# Patient Record
Sex: Female | Born: 1950 | Race: Black or African American | Hispanic: No | Smoking: Never smoker
Health system: Southern US, Community
[De-identification: ages and names within clinical notes are randomized; demographics above are authoritative.]

## PROBLEM LIST (undated history)

## (undated) DIAGNOSIS — D259 Leiomyoma of uterus, unspecified: Secondary | ICD-10-CM

## (undated) DIAGNOSIS — N3289 Other specified disorders of bladder: Secondary | ICD-10-CM

## (undated) DIAGNOSIS — F439 Reaction to severe stress, unspecified: Secondary | ICD-10-CM

## (undated) DIAGNOSIS — I10 Essential (primary) hypertension: Secondary | ICD-10-CM

## (undated) DIAGNOSIS — B009 Herpesviral infection, unspecified: Secondary | ICD-10-CM

## (undated) DIAGNOSIS — D219 Benign neoplasm of connective and other soft tissue, unspecified: Secondary | ICD-10-CM

## (undated) DIAGNOSIS — Z87448 Personal history of other diseases of urinary system: Secondary | ICD-10-CM

## (undated) DIAGNOSIS — N926 Irregular menstruation, unspecified: Secondary | ICD-10-CM

## (undated) DIAGNOSIS — Z973 Presence of spectacles and contact lenses: Secondary | ICD-10-CM

## (undated) DIAGNOSIS — E039 Hypothyroidism, unspecified: Secondary | ICD-10-CM

## (undated) DIAGNOSIS — G4700 Insomnia, unspecified: Secondary | ICD-10-CM

## (undated) DIAGNOSIS — M1990 Unspecified osteoarthritis, unspecified site: Secondary | ICD-10-CM

## (undated) DIAGNOSIS — K5909 Other constipation: Secondary | ICD-10-CM

## (undated) DIAGNOSIS — R002 Palpitations: Secondary | ICD-10-CM

## (undated) DIAGNOSIS — M549 Dorsalgia, unspecified: Secondary | ICD-10-CM

## (undated) HISTORY — DX: Insomnia, unspecified: G47.00

## (undated) HISTORY — PX: OTHER SURGICAL HISTORY: SHX169

## (undated) HISTORY — DX: Irregular menstruation, unspecified: N92.6

## (undated) HISTORY — DX: Dorsalgia, unspecified: M54.9

## (undated) HISTORY — DX: Personal history of other diseases of urinary system: Z87.448

## (undated) HISTORY — DX: Benign neoplasm of connective and other soft tissue, unspecified: D21.9

## (undated) HISTORY — PX: TUBAL LIGATION: SHX77

## (undated) HISTORY — DX: Reaction to severe stress, unspecified: F43.9

---

## 1999-03-18 ENCOUNTER — Other Ambulatory Visit: Admission: RE | Admit: 1999-03-18 | Discharge: 1999-03-18 | Payer: Self-pay | Admitting: Obstetrics and Gynecology

## 2000-01-28 ENCOUNTER — Encounter: Payer: Self-pay | Admitting: Obstetrics and Gynecology

## 2000-01-28 ENCOUNTER — Encounter: Admission: RE | Admit: 2000-01-28 | Discharge: 2000-01-28 | Payer: Self-pay | Admitting: Obstetrics and Gynecology

## 2000-04-25 ENCOUNTER — Other Ambulatory Visit: Admission: RE | Admit: 2000-04-25 | Discharge: 2000-04-25 | Payer: Self-pay | Admitting: Obstetrics and Gynecology

## 2001-02-02 ENCOUNTER — Encounter: Admission: RE | Admit: 2001-02-02 | Discharge: 2001-02-02 | Payer: Self-pay | Admitting: Obstetrics and Gynecology

## 2001-02-02 ENCOUNTER — Encounter: Payer: Self-pay | Admitting: Obstetrics and Gynecology

## 2001-05-07 ENCOUNTER — Other Ambulatory Visit: Admission: RE | Admit: 2001-05-07 | Discharge: 2001-05-07 | Payer: Self-pay | Admitting: Obstetrics and Gynecology

## 2002-02-15 ENCOUNTER — Encounter: Admission: RE | Admit: 2002-02-15 | Discharge: 2002-02-15 | Payer: Self-pay | Admitting: Obstetrics and Gynecology

## 2002-02-15 ENCOUNTER — Encounter: Payer: Self-pay | Admitting: Obstetrics and Gynecology

## 2002-05-13 ENCOUNTER — Other Ambulatory Visit: Admission: RE | Admit: 2002-05-13 | Discharge: 2002-05-13 | Payer: Self-pay | Admitting: Obstetrics and Gynecology

## 2003-01-03 ENCOUNTER — Encounter: Payer: Self-pay | Admitting: Gastroenterology

## 2003-01-03 ENCOUNTER — Encounter: Admission: RE | Admit: 2003-01-03 | Discharge: 2003-01-03 | Payer: Self-pay | Admitting: Gastroenterology

## 2003-03-17 ENCOUNTER — Encounter: Admission: RE | Admit: 2003-03-17 | Discharge: 2003-03-17 | Payer: Self-pay | Admitting: Obstetrics and Gynecology

## 2003-03-17 ENCOUNTER — Encounter: Payer: Self-pay | Admitting: Obstetrics and Gynecology

## 2003-05-26 ENCOUNTER — Other Ambulatory Visit: Admission: RE | Admit: 2003-05-26 | Discharge: 2003-05-26 | Payer: Self-pay | Admitting: Obstetrics and Gynecology

## 2004-03-19 ENCOUNTER — Encounter: Admission: RE | Admit: 2004-03-19 | Discharge: 2004-03-19 | Payer: Self-pay | Admitting: Obstetrics and Gynecology

## 2004-05-26 ENCOUNTER — Other Ambulatory Visit: Admission: RE | Admit: 2004-05-26 | Discharge: 2004-05-26 | Payer: Self-pay | Admitting: Obstetrics and Gynecology

## 2005-03-25 ENCOUNTER — Encounter: Admission: RE | Admit: 2005-03-25 | Discharge: 2005-03-25 | Payer: Self-pay | Admitting: Obstetrics and Gynecology

## 2005-06-15 ENCOUNTER — Other Ambulatory Visit: Admission: RE | Admit: 2005-06-15 | Discharge: 2005-06-15 | Payer: Self-pay | Admitting: Obstetrics and Gynecology

## 2005-06-30 ENCOUNTER — Other Ambulatory Visit: Admission: RE | Admit: 2005-06-30 | Discharge: 2005-06-30 | Payer: Self-pay | Admitting: Obstetrics and Gynecology

## 2006-03-27 ENCOUNTER — Encounter: Admission: RE | Admit: 2006-03-27 | Discharge: 2006-03-27 | Payer: Self-pay | Admitting: Obstetrics and Gynecology

## 2006-06-21 ENCOUNTER — Other Ambulatory Visit: Admission: RE | Admit: 2006-06-21 | Discharge: 2006-06-21 | Payer: Self-pay | Admitting: Obstetrics and Gynecology

## 2006-09-13 ENCOUNTER — Emergency Department (HOSPITAL_COMMUNITY): Admission: EM | Admit: 2006-09-13 | Discharge: 2006-09-13 | Payer: Self-pay | Admitting: Emergency Medicine

## 2007-03-30 ENCOUNTER — Encounter: Admission: RE | Admit: 2007-03-30 | Discharge: 2007-03-30 | Payer: Self-pay | Admitting: Obstetrics and Gynecology

## 2008-04-04 ENCOUNTER — Encounter: Admission: RE | Admit: 2008-04-04 | Discharge: 2008-04-04 | Payer: Self-pay | Admitting: Obstetrics and Gynecology

## 2008-11-28 DIAGNOSIS — E890 Postprocedural hypothyroidism: Secondary | ICD-10-CM

## 2008-11-28 HISTORY — DX: Postprocedural hypothyroidism: E89.0

## 2009-04-01 ENCOUNTER — Encounter: Admission: RE | Admit: 2009-04-01 | Discharge: 2009-04-01 | Payer: Self-pay | Admitting: Emergency Medicine

## 2009-04-10 ENCOUNTER — Encounter: Admission: RE | Admit: 2009-04-10 | Discharge: 2009-04-10 | Payer: Self-pay | Admitting: Obstetrics and Gynecology

## 2009-07-20 ENCOUNTER — Ambulatory Visit (HOSPITAL_COMMUNITY): Admission: RE | Admit: 2009-07-20 | Discharge: 2009-07-20 | Payer: Self-pay | Admitting: Neurosurgery

## 2009-07-20 HISTORY — PX: POSTERIOR LAMINECTOMY / DECOMPRESSION LUMBAR SPINE: SUR740

## 2010-04-16 ENCOUNTER — Encounter: Admission: RE | Admit: 2010-04-16 | Discharge: 2010-04-16 | Payer: Self-pay | Admitting: Obstetrics and Gynecology

## 2010-06-25 ENCOUNTER — Encounter: Admission: RE | Admit: 2010-06-25 | Discharge: 2010-06-25 | Payer: Self-pay | Admitting: Neurosurgery

## 2011-03-05 LAB — TYPE AND SCREEN
ABO/RH(D): B POS
Antibody Screen: NEGATIVE

## 2011-03-05 LAB — CBC
MCHC: 33.7 g/dL (ref 30.0–36.0)
MCV: 90.1 fL (ref 78.0–100.0)
Platelets: 248 10*3/uL (ref 150–400)
WBC: 6.7 10*3/uL (ref 4.0–10.5)

## 2011-03-05 LAB — DIFFERENTIAL
Basophils Relative: 1 % (ref 0–1)
Eosinophils Relative: 0 % (ref 0–5)
Lymphocytes Relative: 27 % (ref 12–46)
Neutro Abs: 4.3 10*3/uL (ref 1.7–7.7)

## 2011-03-05 LAB — BASIC METABOLIC PANEL
Calcium: 9.6 mg/dL (ref 8.4–10.5)
Creatinine, Ser: 0.59 mg/dL (ref 0.4–1.2)
GFR calc non Af Amer: >60 mL/min (ref >60)
Potassium: 3.8 mEq/L (ref 3.5–5.1)
Sodium: 138 mEq/L (ref 135–145)

## 2011-03-05 LAB — ABO/RH: ABO/RH(D): B POS

## 2011-03-11 ENCOUNTER — Other Ambulatory Visit: Payer: Self-pay | Admitting: Obstetrics and Gynecology

## 2011-03-11 DIAGNOSIS — Z1231 Encounter for screening mammogram for malignant neoplasm of breast: Secondary | ICD-10-CM

## 2011-04-12 NOTE — Op Note (Signed)
NAME:  Donna Flynn, Donna Flynn NO.:  [id number]   MEDICAL RECORD NO.:  [id number]          PATIENT TYPE:  OIB   LOCATION:  3537                         FACILITY:  [facility]   PHYSICIAN:  Kathaleen Maser. Pool, M.D.    DATE OF BIRTH:  [birth year]   DATE OF PROCEDURE:  07/20/2009  DATE OF DISCHARGE:  07/20/2009                               OPERATIVE REPORT   PREOPERATIVE DIAGNOSIS:  Right L5-S1 synovial cyst with radiculopathy.   POSTOPERATIVE DIAGNOSIS:  Right L5-S1 synovial cyst with radiculopathy.   PROCEDURE:  Right L5-S1 decompressive laminotomy with resection of  synovial cyst.   SURGEON:  Henry A. Pool, MD   ASSISTANT:  Donalee Citrin, MD   ANESTHESIA:  General endotracheal.   INDICATION:  [name] is a 60 year old female with history of right  lower extremity pain, paresthesia, and weakness consistent with a right-  sided S1 radiculopathy failing conservative management.  Workup  demonstrates evidence of a right-sided L5-S1 synovial cyst with  compression of the right side S1 nerve root.  There is no overt evidence  of instability at this time.  The patient is planning to proceed with a  decompressive laminotomy and resection of cyst in hopes of improvement  of her symptoms.   OPERATIVE NOTE:  The patient was taken to the operating room and placed  on the operating table in supine position.  After adequate level of  anesthesia was achieved, the patient was placed prone onto Wilson frame  and appropriately padded.  The patient's lumbar region was prepped and  draped sterilely.  A 10 blade was used to make a curvilinear skin  incision overlying the L5-S1 interspace and this was carried down  sharply in the midline.  A subperiosteal dissection was performed  exposing the lamina facet joints what was felt to be the L5-S1 level.  Deep self-retaining retractors were placed.  Intraoperative x-rays taken  and in fact the approach was made to the L4-5 level.  The approach was  then redirected one level caudally.  Deep self-retaining retractors were  placed.  Intraoperative x-ray was not felt to be necessary to re-  establish location.  Decompressive laminotomy was then performed using  high-speed drill and Kerrison rongeurs to remove the inferior aspect of  the lamina of L5, medial aspect of the L5-S1 facet joints, superior rim  of the S1 lamina.  The ligamentum flavum was then elevated and resected  in piecemeal fashion using Kerrison rongeurs.  Underlying thecal sac and  exiting S1 nerve root were identified.  Elements of the synovial cyst  were completely resected along the way of decompression.  There was no  injury to thecal sac or nerve roots.  The disk space was inspected and  found to be flat and healthy appearing.  There was no evidence of any  other compressive lesions.  The wound was then irrigated with antibiotic  solution.  Gelfoam was placed topically for hemostasis and found to be  good.  Microscope and dressings were removed.  Hemostasis  was achieved with electrocautery.  The wound was then closed in layers  with Vicryl  suture.  Steri-Strips and sterile dressings were applied.  There were no complications.  The patient tolerated the procedure well  and she returned to the recovery room postoperatively.           ______________________________  Kathaleen Maser Pool, M.D.     HAP/MEDQ  D:  07/20/2009  T:  07/21/2009  Job:  [id number]

## 2011-04-22 ENCOUNTER — Ambulatory Visit
Admission: RE | Admit: 2011-04-22 | Discharge: 2011-04-22 | Disposition: A | Discharge status: Home / Self Care | Payer: BC Managed Care – PPO | Source: Ambulatory Visit | Attending: Obstetrics and Gynecology | Admitting: Obstetrics and Gynecology

## 2011-04-22 DIAGNOSIS — Z1231 Encounter for screening mammogram for malignant neoplasm of breast: Secondary | ICD-10-CM

## 2012-04-06 ENCOUNTER — Other Ambulatory Visit: Payer: Self-pay | Admitting: Obstetrics and Gynecology

## 2012-04-06 DIAGNOSIS — Z1231 Encounter for screening mammogram for malignant neoplasm of breast: Secondary | ICD-10-CM

## 2012-04-27 ENCOUNTER — Ambulatory Visit
Admission: RE | Admit: 2012-04-27 | Discharge: 2012-04-27 | Disposition: A | Discharge status: Home / Self Care | Payer: BC Managed Care – PPO | Source: Ambulatory Visit | Attending: Obstetrics and Gynecology | Admitting: Obstetrics and Gynecology

## 2012-04-27 DIAGNOSIS — Z1231 Encounter for screening mammogram for malignant neoplasm of breast: Secondary | ICD-10-CM

## 2012-05-03 ENCOUNTER — Encounter: Payer: Self-pay | Admitting: Obstetrics and Gynecology

## 2012-10-02 ENCOUNTER — Other Ambulatory Visit: Payer: Self-pay | Admitting: Neurosurgery

## 2012-10-02 DIAGNOSIS — M5417 Radiculopathy, lumbosacral region: Secondary | ICD-10-CM

## 2012-10-02 DIAGNOSIS — M47817 Spondylosis without myelopathy or radiculopathy, lumbosacral region: Secondary | ICD-10-CM

## 2012-10-08 ENCOUNTER — Ambulatory Visit
Admission: RE | Admit: 2012-10-08 | Discharge: 2012-10-08 | Disposition: A | Discharge status: Home / Self Care | Payer: BC Managed Care – PPO | Source: Ambulatory Visit | Attending: Neurosurgery | Admitting: Neurosurgery

## 2012-10-08 VITALS — BP 104/58 | HR 54

## 2012-10-08 DIAGNOSIS — M5417 Radiculopathy, lumbosacral region: Secondary | ICD-10-CM

## 2012-10-08 DIAGNOSIS — M47817 Spondylosis without myelopathy or radiculopathy, lumbosacral region: Secondary | ICD-10-CM

## 2012-10-08 MED ORDER — DIAZEPAM 5 MG PO TABS
10.0000 mg | ORAL_TABLET | Freq: Once | ORAL | Status: AC
  Administered 2012-10-08: 10 mg via ORAL

## 2012-10-08 MED ORDER — IOHEXOL 180 MG/ML  SOLN
15.0000 mL | Freq: Once | INTRAMUSCULAR | Status: AC | PRN
  Administered 2012-10-08: 15 mL via INTRATHECAL

## 2012-11-08 ENCOUNTER — Encounter: Payer: Self-pay | Admitting: Obstetrics and Gynecology

## 2012-11-08 ENCOUNTER — Ambulatory Visit (INDEPENDENT_AMBULATORY_CARE_PROVIDER_SITE_OTHER): Payer: BC Managed Care – PPO | Admitting: Obstetrics and Gynecology

## 2012-11-08 VITALS — BP 118/72 | Resp 18 | Wt 190.0 lb

## 2012-11-08 DIAGNOSIS — Z124 Encounter for screening for malignant neoplasm of cervix: Secondary | ICD-10-CM

## 2012-11-08 DIAGNOSIS — Z01419 Encounter for gynecological examination (general) (routine) without abnormal findings: Secondary | ICD-10-CM

## 2012-11-08 DIAGNOSIS — Z87448 Personal history of other diseases of urinary system: Secondary | ICD-10-CM | POA: Insufficient documentation

## 2012-11-08 DIAGNOSIS — D219 Benign neoplasm of connective and other soft tissue, unspecified: Secondary | ICD-10-CM | POA: Insufficient documentation

## 2012-11-08 DIAGNOSIS — G4700 Insomnia, unspecified: Secondary | ICD-10-CM | POA: Insufficient documentation

## 2012-11-08 NOTE — Progress Notes (Signed)
Subjective:    Donna Flynn is a 61 y.o. female G3P3 who presents for annual exam. The patient complaints of stress. Her son lives at home and is having trouble. The patient has a history of herpes virus. She is not sexually active. She suffers from constipation.  The following portions of the patient's history were reviewed and updated as appropriate: allergies, current medications, past family history, past medical history, past social history, past surgical history and problem list.  Review of Systems Pertinent items are noted in HPI. Gastrointestinal:No change in bowel habits, no abdominal pain, no rectal bleeding Genitourinary:negative for dysuria, frequency, hematuria, nocturia and urinary incontinence    Objective:     BP 118/72  Resp 18  Wt 190 lb (86.183 kg)  Weight:  Wt Readings from Last 1 Encounters:  11/08/12 190 lb (86.183 kg)     BMI: There is no height on file to calculate BMI. General Appearance: Alert, appropriate appearance for age. No acute distress HEENT: Grossly normal Neck / Thyroid: Supple, no masses, nodes or enlargement Lungs: clear to auscultation bilaterally Back: No CVA tenderness Breast Exam: No masses or nodes.No dimpling, nipple retraction or discharge. Cardiovascular: Regular rate and rhythm. S1, S2, no murmur Gastrointestinal: Soft, non-tender, no masses or organomegaly  ++++++++++++++++++++++++++++++++++++++++++++++++++++++++  Pelvic Exam: External genitalia: normal general appearance Vaginal: normal without tenderness, induration or masses and relaxation noted Cervix: normal appearance Adnexa: normal bimanual exam Uterus: 8 week size and irregular Rectovaginal: normal rectal, no masses  ++++++++++++++++++++++++++++++++++++++++++++++++++++++++  Lymphatic Exam: Non-palpable nodes in neck, clavicular, axillary, or inguinal regions  Psychiatric: Alert and oriented, appropriate affect.      Assessment:    Normal gyn exam    Overweight or obese: Yes  Pelvic relaxation: Yes  Menopausal symptoms: Yes. Severe: No.  Herpes virus  Fibroids  Stress   Plan:    Pap smear.   Follow-up:  for annual exam  The updated Pap smear screening guidelines were discussed with the patient. The patient requested that I obtain a Pap smear: Yes.  Kegel exercises discussed: Yes.  Proper diet and regular exercise were reviewed.  Annual mammograms recommended starting at age 55. Proper breast care was discussed.  Screening colonoscopy is recommended beginning at age 74. It is time for the patient is to have a repeat colonoscopy.  Regular health maintenance was reviewed.  Sleep hygiene was discussed.  Adequate calcium and vitamin D intake was emphasized.  Leonard Schwartz M.D.   Regular Periods: no Mammogram: yes  Monthly Breast Ex.: yes Exercise: yes  Tetanus < 10 years: yes Seatbelts: yes  NI. Bladder Functn.: yes Abuse at home: no  Daily BM's: no  Stressful Work: no  Healthy Diet: yes Sigmoid-Colonoscopy: 2003  Calcium: no Medical problems this year: Stress w/ youngest son at home.    LAST PAP: 2010  Contraception: BTL/MENO   Mammogram:  03-2012  PCP: Dr.Grey   PMH: No Changes  FMH: No Changes  Last Bone Scan: N/A

## 2012-11-09 LAB — PAP IG W/ RFLX HPV ASCU

## 2013-04-01 ENCOUNTER — Other Ambulatory Visit: Payer: Self-pay

## 2013-04-01 DIAGNOSIS — Z1231 Encounter for screening mammogram for malignant neoplasm of breast: Secondary | ICD-10-CM

## 2013-05-10 ENCOUNTER — Ambulatory Visit
Admission: RE | Admit: 2013-05-10 | Discharge: 2013-05-10 | Disposition: A | Discharge status: Home / Self Care | Payer: BC Managed Care – PPO | Source: Ambulatory Visit

## 2013-05-10 DIAGNOSIS — Z1231 Encounter for screening mammogram for malignant neoplasm of breast: Secondary | ICD-10-CM

## 2014-04-03 ENCOUNTER — Other Ambulatory Visit: Payer: Self-pay

## 2014-04-03 DIAGNOSIS — Z1231 Encounter for screening mammogram for malignant neoplasm of breast: Secondary | ICD-10-CM

## 2014-05-16 ENCOUNTER — Ambulatory Visit
Admission: RE | Admit: 2014-05-16 | Discharge: 2014-05-16 | Disposition: A | Discharge status: Home / Self Care | Payer: BC Managed Care – PPO | Source: Ambulatory Visit

## 2014-05-16 DIAGNOSIS — Z1231 Encounter for screening mammogram for malignant neoplasm of breast: Secondary | ICD-10-CM

## 2014-09-29 ENCOUNTER — Encounter: Payer: Self-pay | Admitting: Obstetrics and Gynecology

## 2015-04-21 ENCOUNTER — Other Ambulatory Visit: Payer: Self-pay

## 2015-04-21 DIAGNOSIS — Z1231 Encounter for screening mammogram for malignant neoplasm of breast: Secondary | ICD-10-CM

## 2015-05-22 ENCOUNTER — Ambulatory Visit
Admission: RE | Admit: 2015-05-22 | Discharge: 2015-05-22 | Disposition: A | Discharge status: Home / Self Care | Payer: BLUE CROSS/BLUE SHIELD | Source: Ambulatory Visit

## 2015-05-22 DIAGNOSIS — Z1231 Encounter for screening mammogram for malignant neoplasm of breast: Secondary | ICD-10-CM

## 2016-04-07 ENCOUNTER — Other Ambulatory Visit: Payer: Self-pay

## 2016-04-07 DIAGNOSIS — R5381 Other malaise: Secondary | ICD-10-CM

## 2016-04-29 ENCOUNTER — Other Ambulatory Visit: Payer: Self-pay | Admitting: Obstetrics and Gynecology

## 2016-04-29 DIAGNOSIS — Z1231 Encounter for screening mammogram for malignant neoplasm of breast: Secondary | ICD-10-CM

## 2016-05-27 ENCOUNTER — Ambulatory Visit
Admission: RE | Admit: 2016-05-27 | Discharge: 2016-05-27 | Disposition: A | Discharge status: Home / Self Care | Payer: BLUE CROSS/BLUE SHIELD | Source: Ambulatory Visit | Attending: Obstetrics and Gynecology | Admitting: Obstetrics and Gynecology

## 2016-05-27 DIAGNOSIS — Z1231 Encounter for screening mammogram for malignant neoplasm of breast: Secondary | ICD-10-CM

## 2017-05-30 ENCOUNTER — Other Ambulatory Visit: Payer: Self-pay | Admitting: Family

## 2017-05-30 DIAGNOSIS — N63 Unspecified lump in unspecified breast: Secondary | ICD-10-CM

## 2017-06-01 ENCOUNTER — Ambulatory Visit
Admission: RE | Admit: 2017-06-01 | Discharge: 2017-06-01 | Disposition: A | Discharge status: Home / Self Care | Payer: BLUE CROSS/BLUE SHIELD | Source: Ambulatory Visit | Attending: Family | Admitting: Family

## 2017-06-01 DIAGNOSIS — N63 Unspecified lump in unspecified breast: Secondary | ICD-10-CM

## 2017-06-27 ENCOUNTER — Other Ambulatory Visit: Payer: Self-pay | Admitting: Surgery

## 2017-06-27 DIAGNOSIS — R222 Localized swelling, mass and lump, trunk: Secondary | ICD-10-CM

## 2017-07-03 ENCOUNTER — Ambulatory Visit
Admission: RE | Admit: 2017-07-03 | Discharge: 2017-07-03 | Disposition: A | Discharge status: Home / Self Care | Payer: BLUE CROSS/BLUE SHIELD | Source: Ambulatory Visit | Attending: Surgery | Admitting: Surgery

## 2017-07-03 DIAGNOSIS — R222 Localized swelling, mass and lump, trunk: Secondary | ICD-10-CM

## 2017-07-03 MED ORDER — IOPAMIDOL (ISOVUE-300) INJECTION 61%
75.0000 mL | Freq: Once | INTRAVENOUS | Status: AC | PRN
  Administered 2017-07-03: 75 mL via INTRAVENOUS

## 2019-02-06 ENCOUNTER — Other Ambulatory Visit: Payer: Self-pay | Admitting: Family Medicine

## 2019-02-06 DIAGNOSIS — I712 Thoracic aortic aneurysm, without rupture, unspecified: Secondary | ICD-10-CM

## 2019-03-04 ENCOUNTER — Other Ambulatory Visit: Payer: BLUE CROSS/BLUE SHIELD

## 2019-04-16 ENCOUNTER — Other Ambulatory Visit: Payer: Self-pay

## 2019-04-16 ENCOUNTER — Ambulatory Visit
Admission: RE | Admit: 2019-04-16 | Discharge: 2019-04-16 | Disposition: A | Discharge status: Home / Self Care | Payer: BLUE CROSS/BLUE SHIELD | Source: Ambulatory Visit | Attending: Family Medicine | Admitting: Family Medicine

## 2019-04-16 DIAGNOSIS — I712 Thoracic aortic aneurysm, without rupture, unspecified: Secondary | ICD-10-CM

## 2019-04-16 MED ORDER — IOPAMIDOL (ISOVUE-370) INJECTION 76%
75.0000 mL | Freq: Once | INTRAVENOUS | Status: AC | PRN
  Administered 2019-04-16: 75 mL via INTRAVENOUS

## 2019-06-13 ENCOUNTER — Ambulatory Visit: Payer: BLUE CROSS/BLUE SHIELD | Admitting: Family Medicine

## 2019-06-20 ENCOUNTER — Ambulatory Visit (INDEPENDENT_AMBULATORY_CARE_PROVIDER_SITE_OTHER): Payer: BC Managed Care – PPO | Admitting: Surgery

## 2019-06-20 ENCOUNTER — Ambulatory Visit: Payer: Self-pay

## 2019-06-20 ENCOUNTER — Other Ambulatory Visit: Payer: Self-pay

## 2019-06-20 ENCOUNTER — Encounter: Payer: Self-pay | Admitting: Surgery

## 2019-06-20 DIAGNOSIS — M25561 Pain in right knee: Secondary | ICD-10-CM | POA: Diagnosis not present

## 2019-06-20 DIAGNOSIS — M25562 Pain in left knee: Secondary | ICD-10-CM | POA: Diagnosis not present

## 2019-06-20 DIAGNOSIS — S83421A Sprain of lateral collateral ligament of right knee, initial encounter: Secondary | ICD-10-CM

## 2019-06-20 NOTE — Progress Notes (Signed)
Office Visit Note   Patient: Donna Flynn           Date of Birth: [birth year]           MRN: [id number] Visit Date: 06/20/2019              Requested by: Rosita Kea, PA-C [address] [place] [place],   [id number] PCP: Bartholome Bill, MD   Assessment & Plan: Visit Diagnoses:  1. Pain in both knees, unspecified chronicity   2. Sprain of lateral collateral ligament of right knee, initial encounter     Plan: Patient was given samples of Aleve and will take 2 tablets p.o. every 12 hours with food.  Instructed her to ice the knees off and on as needed right more than the left.  Follow-up me in 2 weeks for recheck.  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  If knee still continue to be symptomatic there we did discuss possible injections next office visit.  Would consider scanning the right knee if failed conservative treatment over the next few weeks.  Follow-Up Instructions: Return in about 2 weeks (around 07/04/2019) for Britni Driscoll.   Orders:  Orders Placed This Encounter  Procedures  . XR KNEE 3 VIEW LEFT  . XR KNEE 3 VIEW RIGHT   No orders of the defined types were placed in this encounter.     Procedures: No procedures performed   Clinical Data: No additional findings.   Subjective: Chief Complaint  Patient presents with  . Right Knee - Pain  . Left Knee - Pain    HPI 68 year old white female comes in today with complaints of right greater left knee pain.  Patient states that 2 weeks ago she got off work and was still wearing her steel toed shoes at the park when she was walking in some rocks on on steady ground and she fell forward and she suffered a mild varus type stress to the right knee.  Since this event she has had right greater than left knee soreness.  This is somewhat improved over the last week or so but she is localizing pain to the bilateral patellofemoral compartments and also the lateral right knee.  Has had  some swelling.  No true mechanical symptoms or feeling of instability.  Patient states that she has not really taken anything since she does not like to take medication.  No other issues.  Denies any true knee problems before injury. Review of Systems No current cardiac pulmonary GI GU issues  Objective: Vital Signs: There were no vitals taken for this visit.  Physical Exam HENT:     Head: Normocephalic and atraumatic.  Eyes:     Extraocular Movements: Extraocular movements intact.     Pupils: Pupils are equal, round, and reactive to light.  Pulmonary:     Effort: Pulmonary effort is normal.  Musculoskeletal:     Comments: Gait is minimally antalgic.  Negative logroll bilateral hips.  Bilateral knees good range of motion.  She does have bilateral knee patellofemoral crepitus.  Positive bilateral patellar grind.  She is mildly tender at the bilateral knee joint lines.  Negative McMurray's test.  Cruciate collateral ligaments are stable.  Mildly tender over the right LCL.  No bruising around the knees.  Bilateral calves are nontender.  Neurovascular intact.  Skin:    General: Skin is warm and dry.  Neurological:     General: No focal deficit present.  Mental Status: She is alert and oriented to person, place, and time.  Psychiatric:        Mood and Affect: Mood normal.     Ortho Exam  Specialty Comments:  No specialty comments available.  Imaging: No results found.   PMFS History: Patient Active Problem List   Diagnosis Date Noted  . Insomnia   . H/O hematuria   . Fibroids    Past Medical History:  Diagnosis Date  . Back pain   . Fibroids   . H/O hematuria   . Insomnia   . Irregular periods/menstrual cycles   . Stress     History reviewed. No pertinent family history.  Past Surgical History:  Procedure Laterality Date  . back surgery    . TUBAL LIGATION     Social History   Occupational History  . Not on file  Tobacco Use  . Smoking status: Never Smoker   . Smokeless tobacco: Never Used  Substance and Sexual Activity  . Alcohol use: Yes    Comment: Wine on the weekends   . Drug use: No  . Sexual activity: Yes    Partners: Male    Birth control/protection: Surgical

## 2019-06-21 ENCOUNTER — Encounter: Payer: Self-pay | Admitting: Surgery

## 2019-07-05 ENCOUNTER — Encounter: Payer: Self-pay | Admitting: Family Medicine

## 2019-07-05 ENCOUNTER — Ambulatory Visit (INDEPENDENT_AMBULATORY_CARE_PROVIDER_SITE_OTHER): Payer: BC Managed Care – PPO | Admitting: Family Medicine

## 2019-07-05 DIAGNOSIS — M25561 Pain in right knee: Secondary | ICD-10-CM | POA: Diagnosis not present

## 2019-07-05 NOTE — Progress Notes (Signed)
   Office Visit Note   Patient: Donna Flynn           Date of Birth: 1951/03/11           MRN: [id number] Visit Date: 07/05/2019 Requested by: Bartholome Bill, [place],  [place] [address] PCP: Bartholome Bill, MD  Subjective: Chief Complaint  Patient presents with  . Right Knee - Pain    Pain and tightness in the right knee. Hurts to bend the knee.    HPI: She is here with worsening right knee pain.  Left knee is tolerable, but the right one is stiff and swollen and she is hardly able to bend it.              ROS: Denies fevers or chills.  All other systems were reviewed and are negative.  Objective: Vital Signs: There were no vitals taken for this visit.  Physical Exam:  General:  Alert and oriented, in no acute distress. Pulm:  Breathing unlabored. Psy:  Normal mood, congruent affect. Skin: No rash or erythema. Right knee: 1-2+ effusion with no warmth or erythema.  Very tender on the medial joint line.  Very painful to try to flex the knee beyond about 75 degrees.   Imaging: None today.  Assessment & Plan: 1.  Worsening right knee pain with effusion, question medial meniscus tear. -Discussed options with patient and elected to aspirate and inject the knee today.  If no better next week, she will call me and I will order MRI scan.     Procedures: Right knee aspiration and injection: After sterile prep with Betadine, injected 5 cc 1% lidocaine without epinephrine then aspirated 20 cc blood-tinged synovial fluid, then injected 40 mg methylprednisolone from superolateral approach.    PMFS History: Patient Active Problem List   Diagnosis Date Noted  . Insomnia   . H/O hematuria   . Fibroids    Past Medical History:  Diagnosis Date  . Back pain   . Fibroids   . H/O hematuria   . Insomnia   . Irregular periods/menstrual cycles   . Stress     History reviewed. No pertinent family history.  Past Surgical History:   Procedure Laterality Date  . back surgery    . TUBAL LIGATION     Social History   Occupational History  . Not on file  Tobacco Use  . Smoking status: Never Smoker  . Smokeless tobacco: Never Used  Substance and Sexual Activity  . Alcohol use: Yes    Comment: Wine on the weekends   . Drug use: No  . Sexual activity: Yes    Partners: Male    Birth control/protection: Surgical

## 2019-07-09 ENCOUNTER — Ambulatory Visit: Payer: BC Managed Care – PPO | Admitting: Family Medicine

## 2019-07-10 ENCOUNTER — Telehealth: Payer: Self-pay | Admitting: Family Medicine

## 2019-07-10 DIAGNOSIS — M25561 Pain in right knee: Secondary | ICD-10-CM

## 2019-07-10 NOTE — Telephone Encounter (Signed)
Patient called asked if she can be set up for a CT scan. Patient said she is still in a lot of pain in her right knee. The number to contact patient is [phone]

## 2019-07-10 NOTE — Telephone Encounter (Signed)
Please advise 

## 2019-07-11 NOTE — Telephone Encounter (Signed)
MRI ordered (better test for knee than CT scan).

## 2019-07-11 NOTE — Telephone Encounter (Signed)
I called and left message on the voice mail - MRI order has been placed.

## 2019-07-23 ENCOUNTER — Telehealth: Payer: Self-pay | Admitting: Family Medicine

## 2019-07-23 MED ORDER — IBUPROFEN 800 MG PO TABS: 800.0000 mg | ORAL_TABLET | Freq: Three times a day (TID) | ORAL | 3 refills | Status: DC | PRN

## 2019-07-23 NOTE — Telephone Encounter (Signed)
Please advise 

## 2019-07-23 NOTE — Telephone Encounter (Signed)
Patient called requesting an RX for Motrin 800mg  be called into her pharmacy.  Pt uses [facility] on [address]  [address][phone].  Thank you.

## 2019-07-23 NOTE — Telephone Encounter (Signed)
I called and left a message on the patient's voice mail, informing her the medication has been sent in to her pharmacy.

## 2019-07-23 NOTE — Telephone Encounter (Signed)
Rx sent 

## 2019-08-13 ENCOUNTER — Ambulatory Visit
Admission: RE | Admit: 2019-08-13 | Discharge: 2019-08-13 | Disposition: A | Discharge status: Home / Self Care | Payer: BC Managed Care – PPO | Source: Ambulatory Visit | Attending: Family Medicine | Admitting: Family Medicine

## 2019-08-13 ENCOUNTER — Other Ambulatory Visit: Payer: Self-pay

## 2019-08-13 DIAGNOSIS — M25561 Pain in right knee: Secondary | ICD-10-CM

## 2019-08-14 ENCOUNTER — Other Ambulatory Visit: Payer: BC Managed Care – PPO

## 2019-08-14 ENCOUNTER — Telehealth: Payer: Self-pay | Admitting: Family Medicine

## 2019-08-14 NOTE — Telephone Encounter (Signed)
Right knee MRI scan shows the following:  There is a tear in the medial meniscus cartilage.  This could be a source of pain.  However, there is also a nondisplaced fracture/bone bruise of the medial femoral condyle.  This is most likely causing much of the pain.  I recommend allowing the bone to heal for the next 6 to 8 weeks by minimizing strenuous activity.  It is okay to stand and walk for day today purposes, but I would avoid any exercise walking.  In addition, to help with bone healing I recommend the following:  Vitamin D3 at 5000 IU daily. Vitamin K2 at 100 mcg daily Magnesium at 200-400 mg daily.  We should reevaluate in about 4 to 6 weeks.  If not improving, then we may consult with 1 of the surgeons.

## 2019-08-15 NOTE — Telephone Encounter (Signed)
I called and advised the patient of her results and instructions. She voice understanding about the weightbearing exercise restriction and the vitamin regimen that she should start. She will call us back to set up an appointment around the end of October for a follow up. I advised her to give [place] a call with any questions and definitely if she worsens between now and 6 weeks from now.

## 2019-09-10 ENCOUNTER — Other Ambulatory Visit: Payer: Self-pay | Admitting: Family Medicine

## 2019-09-10 DIAGNOSIS — Z1231 Encounter for screening mammogram for malignant neoplasm of breast: Secondary | ICD-10-CM

## 2019-09-24 IMAGING — MR MR KNEE*R* W/O CM
6 series · 38 of 40 positions shown · non-contrast
Comparison: Plain films right knee 06/20/2019.

CLINICAL DATA: Right knee pain since April 2019.  No known injury.

EXAM:
MRI OF THE RIGHT KNEE WITHOUT CONTRAST
TECHNIQUE: Multiplanar, multisequence MR imaging of the knee was performed. No
intravenous contrast was administered.

[Series 6: T2 fat-sat · axial · right · 4.0mm · 0.50mm/px · z∈[-67,+87]mm · 9 of 36 slices shown (1 of 3)]
[im 1/36]
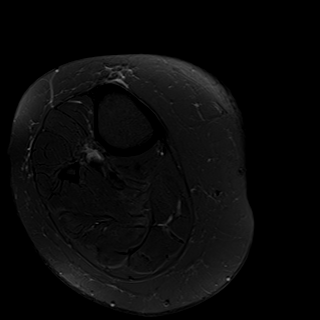
[im 5/36]
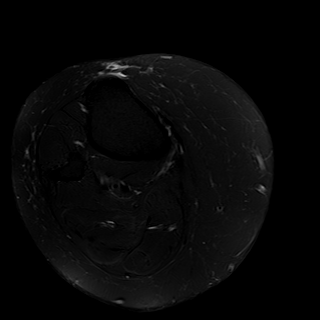
[im 9/36]
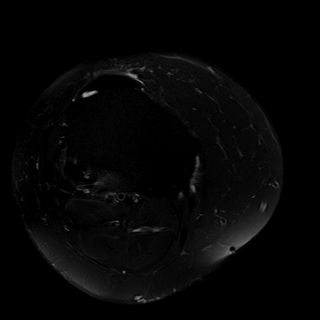
[im 14/36]
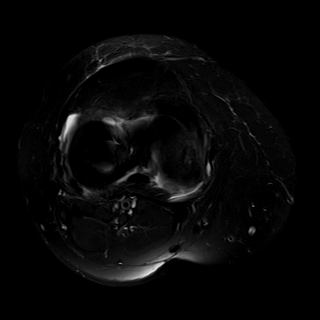
[im 18/36]
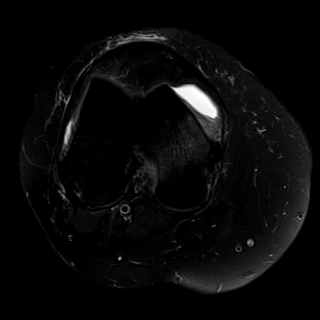
[im 22/36]
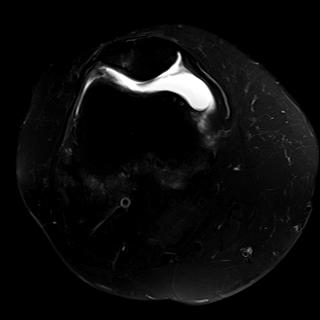
[im 27/36]
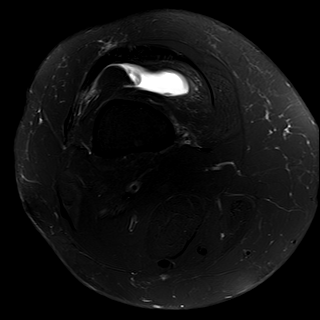
[im 31/36]
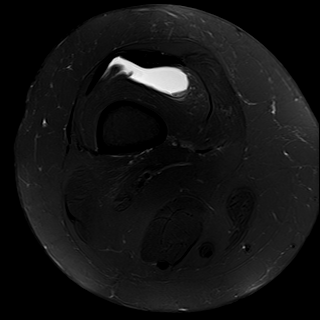
[im 36/36]
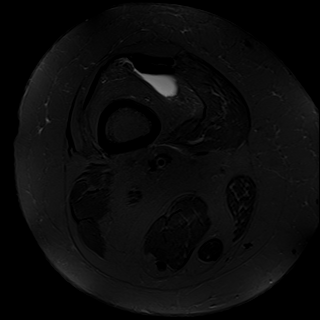

[Series 7: T2 fat-sat · coronal · right · 4.0mm · 0.39mm/px · 6 of 28 slices shown (2 of 3)]
[im 1/28]
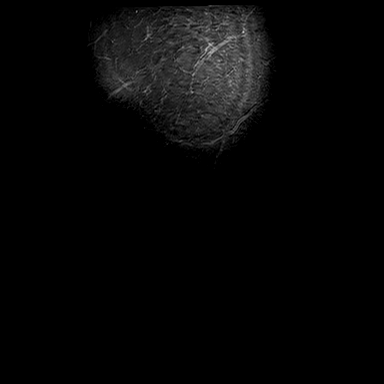
[im 6/28]
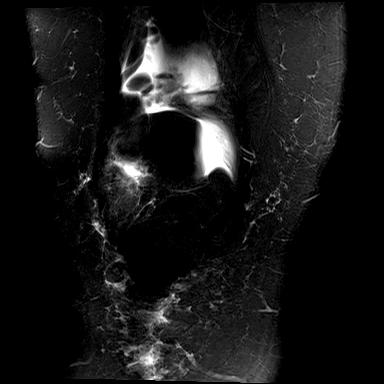
[im 11/28]
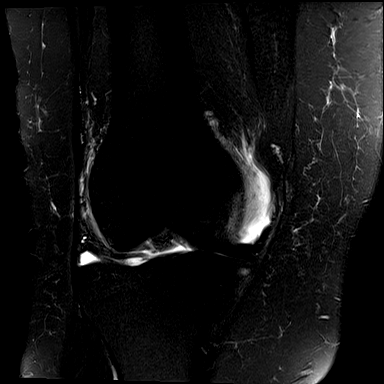
[im 17/28]
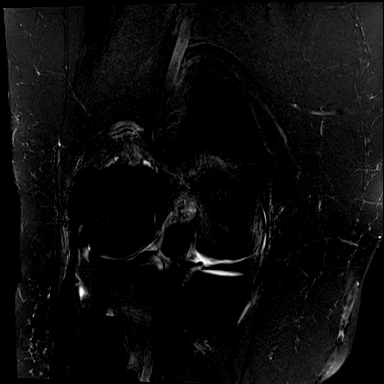
[im 22/28]
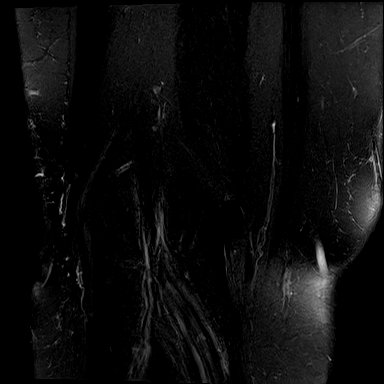
[im 28/28]
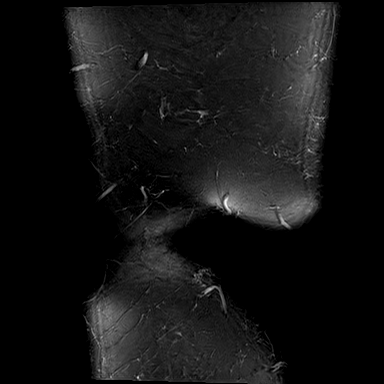

[Series 8: T1 · coronal · right · 4.0mm · 0.39mm/px · 4 of 28 slices shown]
[im 1/28]
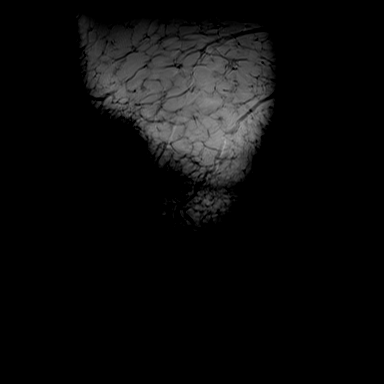
[im 6/28]
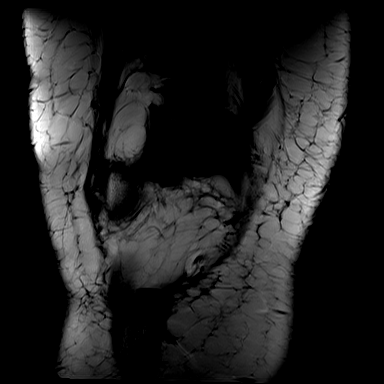
[im 11/28]
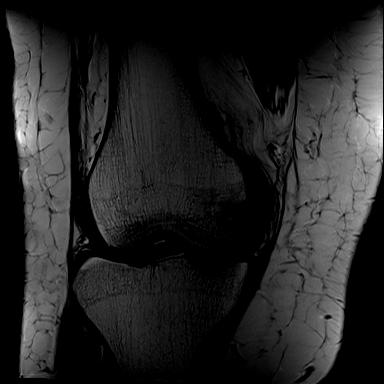
[im 17/28]
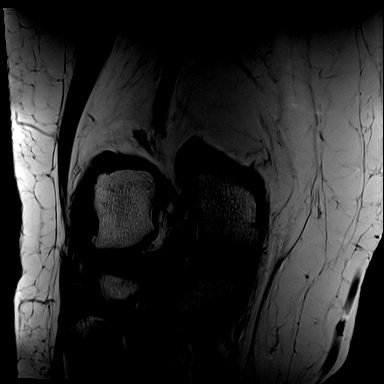

[Series 9: PD fat-sat · coronal · right · 3.0mm · 0.47mm/px · 7 of 34 slices shown (1 of 2)]
[im 1/34]
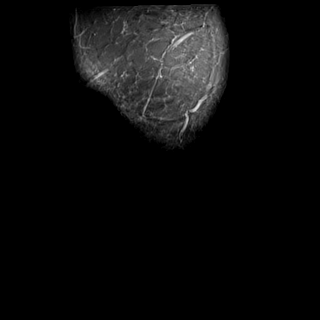
[im 6/34]
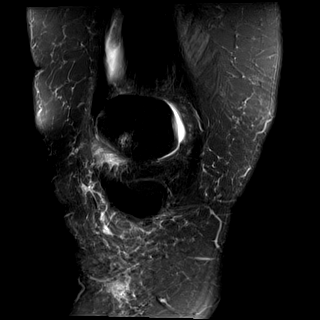
[im 12/34]
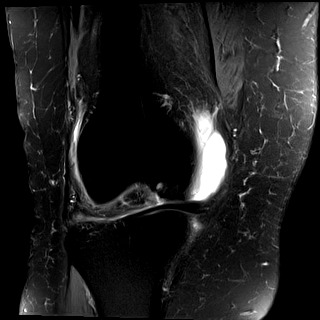
[im 17/34]
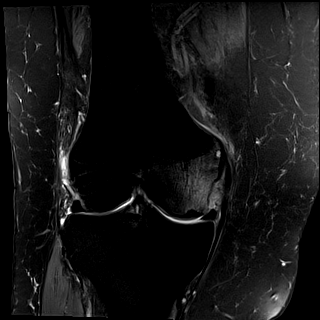
[im 23/34]
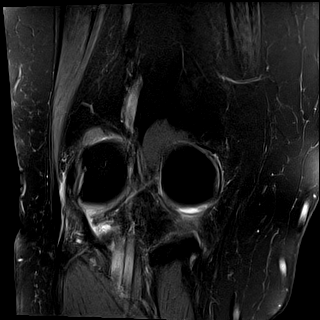
[im 28/34]
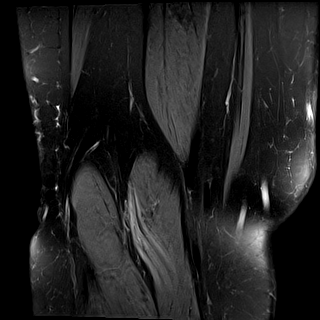
[im 34/34]
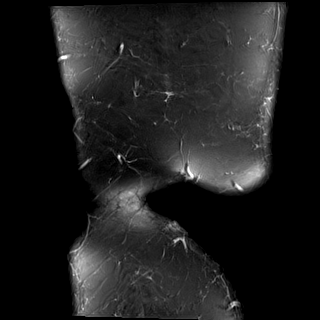

[Series 10: PD fat-sat · sagittal · right · 3.0mm · 0.39mm/px · 6 of 29 slices shown (2 of 2)]
[im 1/29]
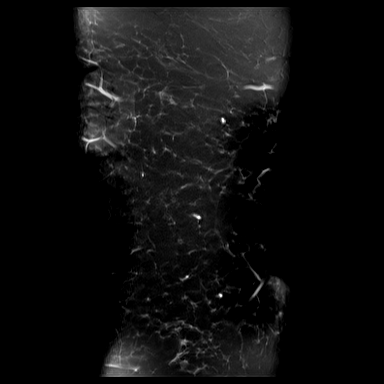
[im 6/29]
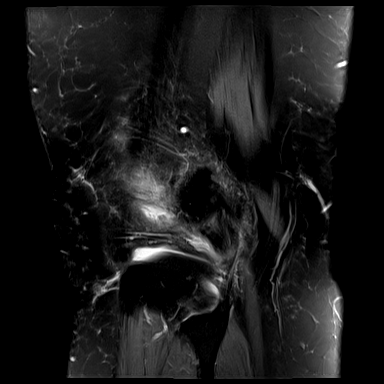
[im 12/29]
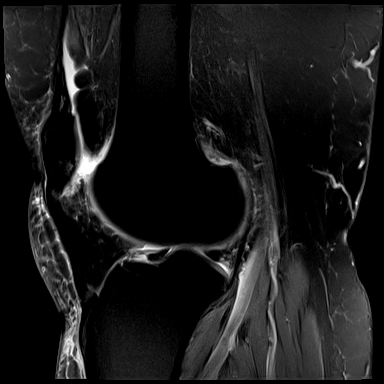
[im 17/29]
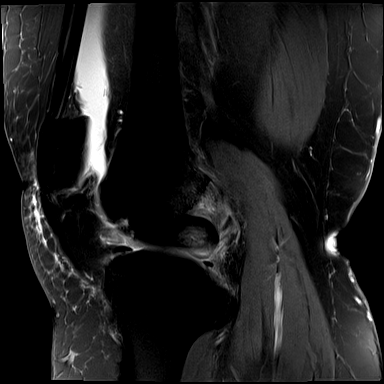
[im 23/29]
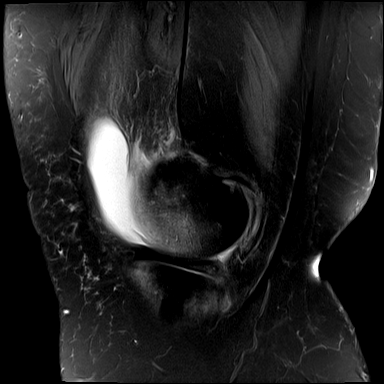
[im 29/29]
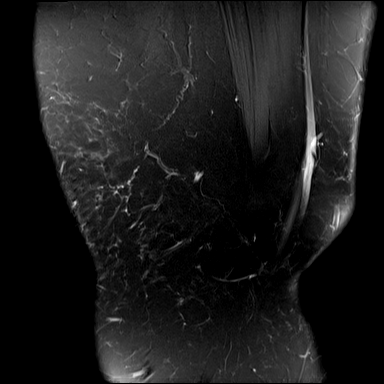

[Series 11: T2 fat-sat · sagittal · right · 3.0mm · 0.39mm/px · 6 of 29 slices shown (3 of 3)]
[im 1/29]
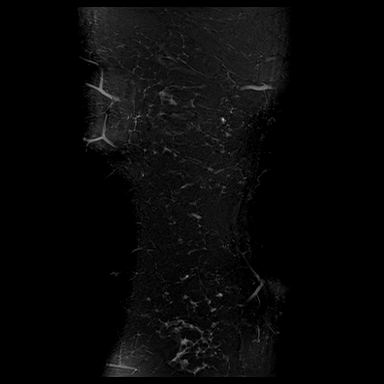
[im 6/29]
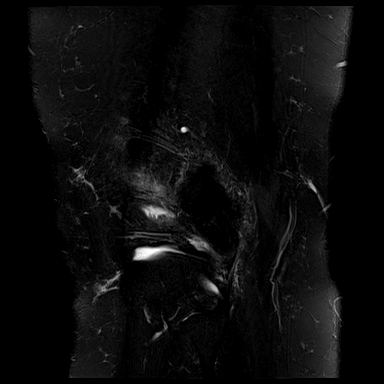
[im 12/29]
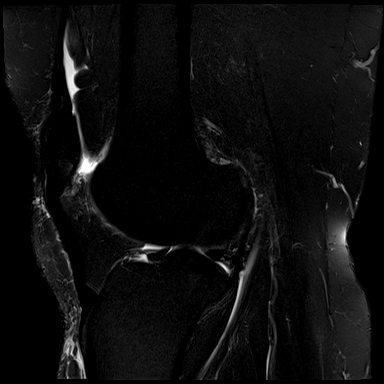
[im 17/29]
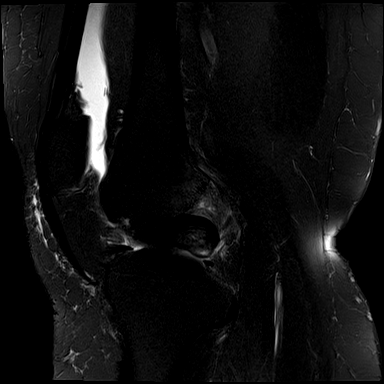
[im 23/29]
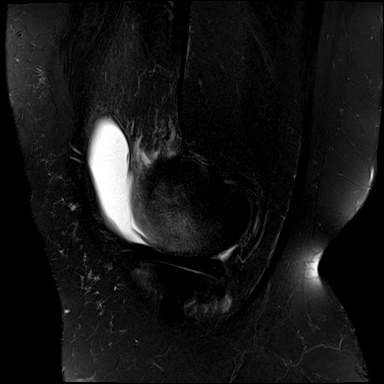
[im 29/29]
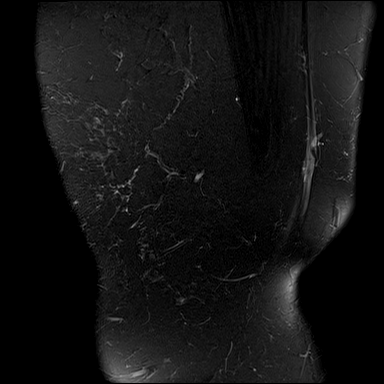

[38 of 40 positions shown; findings below may reference images not displayed]

FINDINGS: MENISCI

Medial meniscus: There is a complete radial tear through the root of
the posterior horn.

Lateral meniscus:  Intact.

LIGAMENTS

Cruciates:  Intact.

Collaterals:  Intact.

CARTILAGE

Patellofemoral: Cartilage thinning and irregularity are worst along
the lateral patellar facet in the inferior pole. Cartilage loss is
also seen along the inferior aspect of the medial femoral trochlea.

Medial:  Frayed and irregular throughout.

Lateral:  Minimally degenerated.

Joint:  Moderate joint effusion.

Popliteal Fossa:  No Baker's cyst.

Extensor Mechanism:  Intact.

Bones: There is a nondisplaced fracture of the subchondral bone
plate of the central weight-bearing medial femoral condyle measuring
approximately 1.3 cm transverse by 1.7 cm AP with intense underlying
subchondral edema. Small subchondral cysts are noted the inferior
pole of the patella on the lateral facet and inferior medial femoral
trochlea.

Other: None.
IMPRESSION: Radial tear through the root of the posterior horn of the medial
meniscus.

Nondisplaced fracture of the subchondral bone plate of the medial
femoral condyle with associated marrow edema consistent with acute
or subacute injury.

Mild to moderate osteoarthritis appears worst in the medial and
patellofemoral compartments.

## 2019-10-09 ENCOUNTER — Encounter: Payer: Self-pay | Admitting: Family Medicine

## 2019-10-09 ENCOUNTER — Other Ambulatory Visit: Payer: Self-pay

## 2019-10-09 ENCOUNTER — Ambulatory Visit (INDEPENDENT_AMBULATORY_CARE_PROVIDER_SITE_OTHER): Payer: BC Managed Care – PPO | Admitting: Family Medicine

## 2019-10-09 DIAGNOSIS — M25561 Pain in right knee: Secondary | ICD-10-CM

## 2019-10-09 MED ORDER — NABUMETONE 750 MG PO TABS: 750.0000 mg | ORAL_TABLET | Freq: Two times a day (BID) | ORAL | 6 refills | Status: DC | PRN

## 2019-10-09 NOTE — Progress Notes (Signed)
   Office Visit Note   Patient: Donna Flynn           Date of Birth: 10-16-1951           MRN: [id number] Visit Date: 10/09/2019 Requested by: Bartholome Bill, [place] [place],  [place] [address] PCP: Bartholome Bill, MD  Subjective: Chief Complaint  Patient presents with  . Right Knee - Pain, Follow-up    Knee is some better. Swells at times - swollen today.    HPI: She is here for follow-up right knee pain with a meniscus tear as well as fracture of the medial femoral condyle.  It has been roughly 3 months since her injury.  Significantly better than last time, she feels like the bone part has healed.  She still has stiffness and swelling in her knee and occasional pain on the medial aspect.              ROS: No fevers or chills.  All other systems were reviewed and are negative.  Objective: Vital Signs: There were no vitals taken for this visit.  Physical Exam:  General:  Alert and oriented, in no acute distress. Pulm:  Breathing unlabored. Psy:  Normal mood, congruent affect. Skin: No rash or erythema. Right knee: 1-2+ effusion with no warmth.  Full extension and flexion of about 120 degrees.  She is tender on the medial joint line, no palpable click with McMurray's.  No significant tenderness over the medial femoral condyle.  Imaging: None today.  Assessment & Plan: 1.  Improved right knee pain, probably healed medial femoral condyle fracture but still with joint effusion and medial joint line tenderness most likely due to medial meniscus tear. -Her pain is manageable, she wants to try Relafen instead of ibuprofen.  If symptoms persist, then we will aspirate and inject 1 more time prior to consulting with surgeon.     Procedures: No procedures performed  No notes on file     PMFS History: Patient Active Problem List   Diagnosis Date Noted  . Insomnia   . H/O hematuria   . Fibroids    Past Medical History:  Diagnosis  Date  . Back pain   . Fibroids   . H/O hematuria   . Insomnia   . Irregular periods/menstrual cycles   . Stress     No family history on file.  Past Surgical History:  Procedure Laterality Date  . back surgery    . TUBAL LIGATION     Social History   Occupational History  . Not on file  Tobacco Use  . Smoking status: Never Smoker  . Smokeless tobacco: Never Used  Substance and Sexual Activity  . Alcohol use: Yes    Comment: Wine on the weekends   . Drug use: No  . Sexual activity: Yes    Partners: Male    Birth control/protection: Surgical

## 2019-10-23 ENCOUNTER — Ambulatory Visit
Admission: RE | Admit: 2019-10-23 | Discharge: 2019-10-23 | Disposition: A | Discharge status: Home / Self Care | Payer: BC Managed Care – PPO | Source: Ambulatory Visit | Attending: Family Medicine | Admitting: Family Medicine

## 2019-10-23 ENCOUNTER — Other Ambulatory Visit: Payer: Self-pay

## 2019-10-23 DIAGNOSIS — Z1231 Encounter for screening mammogram for malignant neoplasm of breast: Secondary | ICD-10-CM

## 2020-03-27 ENCOUNTER — Other Ambulatory Visit: Payer: Self-pay | Admitting: Family Medicine

## 2020-06-02 ENCOUNTER — Other Ambulatory Visit: Payer: Self-pay

## 2020-06-02 ENCOUNTER — Ambulatory Visit (INDEPENDENT_AMBULATORY_CARE_PROVIDER_SITE_OTHER): Payer: BC Managed Care – PPO | Admitting: Family Medicine

## 2020-06-02 ENCOUNTER — Encounter: Payer: Self-pay | Admitting: Family Medicine

## 2020-06-02 DIAGNOSIS — M25561 Pain in right knee: Secondary | ICD-10-CM | POA: Diagnosis not present

## 2020-06-02 DIAGNOSIS — G8929 Other chronic pain: Secondary | ICD-10-CM | POA: Diagnosis not present

## 2020-06-02 NOTE — Progress Notes (Signed)
   Office Visit Note   Patient: Donna Flynn           Date of Birth: 26-Mar-1951           MRN: [id number] Visit Date: 06/02/2020 Requested by: Bartholome Bill, [place],  [place] [address] PCP: Bartholome Bill, MD  Subjective: Chief Complaint  Patient presents with  . Right Knee - Pain    Persistent knee pain. Wearing salon pas patch on the medial aspect - does help some. Requesting an injection &/or a PT referral.    HPI: She is here with right knee pain.  Ongoing pain on the medial aspect.  Aspiration and injection last year gave only temporary relief.  She uses ibuprofen and Relafen, alternates them.  She uses an over-the-counter patch topically.  She is interested in trying another injection and physical therapy if possible.               ROS:   All other systems were reviewed and are negative.  Objective: Vital Signs: There were no vitals taken for this visit.  Physical Exam:  General:  Alert and oriented, in no acute distress. Pulm:  Breathing unlabored. Psy:  Normal mood, congruent affect. Skin: No erythema Right knee: 1+ effusion with no warmth.  Tender on the medial joint line.  Pain and a palpable click with McMurray's.  Imaging: No results found.  Assessment & Plan: 1.  Chronic right knee pain with documented medial meniscus tear -Elected to inject with cortisone 1 more time and refer her to physical therapy.  If symptoms persist, then surgical consult.     Procedures: Right knee steroid injection: After sterile prep with Betadine, injected 5 cc 1% lidocaine without epinephrine and 40 mg methylprednisolone from medial midpatellar approach, a flash of clear yellow synovial fluid was obtained prior to injection.    PMFS History: Patient Active Problem List   Diagnosis Date Noted  . Insomnia   . H/O hematuria   . Fibroids    Past Medical History:  Diagnosis Date  . Back pain   . Fibroids   . H/O hematuria    . Insomnia   . Irregular periods/menstrual cycles   . Stress     History reviewed. No pertinent family history.  Past Surgical History:  Procedure Laterality Date  . back surgery    . TUBAL LIGATION     Social History   Occupational History  . Not on file  Tobacco Use  . Smoking status: Never Smoker  . Smokeless tobacco: Never Used  Substance and Sexual Activity  . Alcohol use: Yes    Comment: Wine on the weekends   . Drug use: No  . Sexual activity: Yes    Partners: Male    Birth control/protection: Surgical

## 2020-12-07 ENCOUNTER — Other Ambulatory Visit: Payer: Self-pay | Admitting: Family Medicine

## 2020-12-07 MED ORDER — IBUPROFEN 800 MG PO TABS: 800.0000 mg | ORAL_TABLET | Freq: Three times a day (TID) | ORAL | 3 refills | Status: DC | PRN

## 2021-02-17 ENCOUNTER — Telehealth: Payer: Self-pay

## 2021-02-17 NOTE — Telephone Encounter (Signed)
NOTES ON FILE FROM [facility] [phone], SENT REFERRAL TO SCHEDULING 

## 2021-03-18 HISTORY — PX: CYSTOCELE REPAIR: SHX163

## 2021-03-28 ENCOUNTER — Other Ambulatory Visit: Payer: Self-pay | Admitting: Family Medicine

## 2021-04-19 NOTE — Progress Notes (Deleted)
Cardiology Office Note:    Date:  04/19/2021   ID:  Donna Flynn, DOB Feb 07, 1951, MRN [id number]  PCP:  Bartholome Bill, MD   [facility] Providers Cardiologist:  None {     Referring MD: Jacelyn Pi, MD    History of Present Illness:    Donna Flynn is a 70 y.o. female with a hx of HTN who was referred by Dr. Chalmers Cater for further evaluation of palpitations.  Past Medical History:  Diagnosis Date  . Back pain   . Fibroids   . H/O hematuria   . Insomnia   . Irregular periods/menstrual cycles   . Stress     Past Surgical History:  Procedure Laterality Date  . back surgery    . TUBAL LIGATION      Current Medications: No outpatient medications have been marked as taking for the 04/21/21 encounter (Appointment) with Freada Bergeron, MD.     Allergies:   Patient has no known allergies.   Social History   Socioeconomic History  . Marital status: Divorced    Spouse name: Not on file  . Number of children: Not on file  . Years of education: Not on file  . Highest education level: Not on file  Occupational History  . Not on file  Tobacco Use  . Smoking status: Never Smoker  . Smokeless tobacco: Never Used  Substance and Sexual Activity  . Alcohol use: Yes    Comment: Wine on the weekends   . Drug use: No  . Sexual activity: Yes    Partners: Male    Birth control/protection: Surgical  Other Topics Concern  . Not on file  Social History Narrative  . Not on file   Social Determinants of Health   Financial Resource Strain: Not on file  Food Insecurity: Not on file  Transportation Needs: Not on file  Physical Activity: Not on file  Stress: Not on file  Social Connections: Not on file     Family History: The patient's ***family history is not on file.  ROS:   Please see the history of present illness.    *** All other systems reviewed and are negative.  EKGs/Labs/Other Studies Reviewed:    The following studies were reviewed  today: ***  EKG:  EKG is *** ordered today.  The ekg ordered today demonstrates ***  Recent Labs: No results found for requested labs within last 8760 hours.  Recent Lipid Panel No results found for: CHOL, TRIG, HDL, CHOLHDL, VLDL, LDLCALC, LDLDIRECT   Risk Assessment/Calculations:   {Does this patient have ATRIAL FIBRILLATION?:[id number]}   Physical Exam:    VS:  There were no vitals taken for this visit.    Wt Readings from Last 3 Encounters:  11/08/12 190 lb (86.2 kg)     GEN: *** Well nourished, well developed in no acute distress HEENT: Normal NECK: No JVD; No carotid bruits LYMPHATICS: No lymphadenopathy CARDIAC: ***RRR, no murmurs, rubs, gallops RESPIRATORY:  Clear to auscultation without rales, wheezing or rhonchi  ABDOMEN: Soft, non-tender, non-distended MUSCULOSKELETAL:  No edema; No deformity  SKIN: Warm and dry NEUROLOGIC:  Alert and oriented x 3 PSYCHIATRIC:  Normal affect   ASSESSMENT:    No diagnosis found. PLAN:    In order of problems listed above:  #Palpitations: -Zio monitor -Continue atenolol 50mg  daily -Increase hydration -Avoid caffeine -Minimize alcohol intake  #HTN: -Continue atenolol 50mg  daily   {Are you ordering a CV Procedure (e.g. stress test, cath, DCCV, TEE,  etc)?   Press F2        :[id number]    Medication Adjustments/Labs and Tests Ordered: Current medicines are reviewed at length with the patient today.  Concerns regarding medicines are outlined above.  No orders of the defined types were placed in this encounter.  No orders of the defined types were placed in this encounter.   There are no Patient Instructions on file for this visit.   Signed, Freada Bergeron, MD  04/19/2021 8:48 PM    [facility]

## 2021-04-21 ENCOUNTER — Other Ambulatory Visit: Payer: Self-pay

## 2021-04-21 ENCOUNTER — Ambulatory Visit (INDEPENDENT_AMBULATORY_CARE_PROVIDER_SITE_OTHER): Payer: BC Managed Care – PPO | Admitting: Cardiology

## 2021-04-21 ENCOUNTER — Encounter: Payer: Self-pay | Admitting: Cardiology

## 2021-04-21 VITALS — BP 130/74 | HR 75 | Ht 64.0 in | Wt 201.4 lb

## 2021-04-21 DIAGNOSIS — I10 Essential (primary) hypertension: Secondary | ICD-10-CM

## 2021-04-21 DIAGNOSIS — R002 Palpitations: Secondary | ICD-10-CM | POA: Diagnosis not present

## 2021-04-21 DIAGNOSIS — E039 Hypothyroidism, unspecified: Secondary | ICD-10-CM

## 2021-04-21 NOTE — Progress Notes (Signed)
Cardiology Office Note:    Date:  04/21/2021   ID:  Donna Flynn, DOB [birth year], MRN [id number]  PCP:  Bartholome Bill, MD   [facility] Providers Cardiologist:  None {     Referring MD: Jacelyn Pi, MD    History of Present Illness:    Donna Flynn is a 70 y.o. female with a hx of HTN who was referred by Dr. Chalmers Cater for further evaluation of palpitations.  Today, she is feeling fairly well. She had a thyroidectomy in 2010 and she explains having palpitation after her synthroid dose was adjusted. Since that time she her dosage has been decreased and her palpitations have significantly improved.  BP Readings from Last 3 Encounters:  04/21/21 130/74  11/08/12 118/72  10/08/12 (!) 104/58   The patient is otherwise active with no exertional symptoms. She has occasional knee pain but this is secondary to her ACL injury during exercising in the past. She is being managed by Ortho. She denies experiencing any excertional chest pain, tightness,or pressure, shortness of breath. She denies any lightheadedness, dizziness, snycpal episodes, orthopnea, PND, or LE edema.  08/17/20-LDL 118, Cholesterol 196, HDL 66  Past Medical History:  Diagnosis Date  . Back pain   . Fibroids   . H/O hematuria   . Insomnia   . Irregular periods/menstrual cycles   . Stress     Past Surgical History:  Procedure Laterality Date  . back surgery    . TUBAL LIGATION      Current Medications: Current Meds  Medication Sig  . atenolol (TENORMIN) 50 MG tablet Take 50 mg by mouth daily.  . Cholecalciferol (VITAMIN D3) 125 MCG (5000 UT) CAPS Take by mouth daily.  . EUTHYROX 88 MCG tablet Take 88 mcg by mouth. Mon-Sat only  . ibuprofen (ADVIL) 800 MG tablet Take 1 tablet (800 mg total) by mouth every 8 (eight) hours as needed.  Marland Kitchen levothyroxine (SYNTHROID) 100 MCG tablet Take 100 mcg by mouth. Sunday only  . magnesium oxide (MAG-OX) 400 MG tablet Take 400 mg by mouth daily.  .  Menaquinone-7 (VITAMIN K2) 100 MCG CAPS Take by mouth daily.  . Multiple Vitamins-Minerals (ALIVE WOMENS ENERGY) TABS Take by mouth.  . vitamin B-12 (CYANOCOBALAMIN) 1000 MCG tablet Take by mouth.  . vitamin E 200 UNIT capsule Take 200 Units by mouth daily.  . Zinc Sulfate (ZINC-220 PO) Take by mouth daily.     Allergies:   Patient has no known allergies.   Social History   Socioeconomic History  . Marital status: Divorced    Spouse name: Not on file  . Number of children: Not on file  . Years of education: Not on file  . Highest education level: Not on file  Occupational History  . Not on file  Tobacco Use  . Smoking status: Never Smoker  . Smokeless tobacco: Never Used  Substance and Sexual Activity  . Alcohol use: Yes    Comment: Wine on the weekends   . Drug use: No  . Sexual activity: Yes    Partners: Male    Birth control/protection: Surgical  Other Topics Concern  . Not on file  Social History Narrative  . Not on file   Social Determinants of Health   Financial Resource Strain: Not on file  Food Insecurity: Not on file  Transportation Needs: Not on file  Physical Activity: Not on file  Stress: Not on file  Social Connections: Not on file  Family History: The patient's family history is not on file.  ROS: Review of Systems  Constitutional: Negative for chills and fever.  HENT: Negative for congestion.   Respiratory: Negative for shortness of breath.   Cardiovascular: Positive for palpitations. Negative for chest pain, orthopnea, leg swelling and PND.  Gastrointestinal: Negative for blood in stool, constipation, nausea and vomiting.  Genitourinary: Negative for dysuria and hematuria.  Skin: Negative for rash.  Neurological: Negative for dizziness and headaches.     Please see the history of present illness.      EKGs/Labs/Other Studies Reviewed:    EKG:   04/21/2021-sinus rhythm, rate 75    Recent Labs: No results found for requested labs  within last 8760 hours.  Recent Lipid Panel No results found for: CHOL, TRIG, HDL, CHOLHDL, VLDL, LDLCALC, LDLDIRECT   Risk Assessment/Calculations:       Physical Exam:    VS:  BP 130/74   Pulse 75   Ht 5\' 4"  (1.626 m)   Wt 201 lb 6.4 oz (91.4 kg)   SpO2 94%   BMI 34.57 kg/m     Wt Readings from Last 3 Encounters:  04/21/21 201 lb 6.4 oz (91.4 kg)  11/08/12 190 lb (86.2 kg)     GEN: Well nourished, well developed in no acute distress HEENT: Normal NECK: No JVD; No carotid bruits LYMPHATICS: No lymphadenopathy CARDIAC: RRR, no murmurs, rubs, gallops RESPIRATORY:  Clear to auscultation without rales, wheezing or rhonchi  ABDOMEN: Soft, non-tender, non-distended MUSCULOSKELETAL:  No edema; No deformity  SKIN: Warm and dry NEUROLOGIC:  Alert and oriented x 3 PSYCHIATRIC:  Normal affect   ASSESSMENT:    1. Palpitations   2. Primary hypertension   3. Acquired hypothyroidism    PLAN:    In order of problems listed above:  #Palpitations: #Acquired hypothyroidism with history of thyroidectomy: Patient states that she had a thyroidectomy in 2010 and has been on synthroid. Recently, she developed palpitations and her synthroid was subsequently adjusted. Since that time, her palpitations have resolved. No chest pain, exertional symptoms, shortness of breath, lightheadedness or dizziness. Given significant improvement of symptoms, no indication for further testing at this time. If recurs, will pursue zio monitor at that time. -Symptoms resolved with adjusting synthroid dosing -Continue atenolol 50mg  daily -Continue hydration -Avoid caffeine -Minimize alcohol intake  #HTN: Well controlled at home. -Continue atenolol 50mg  daily   Medication Adjustments/Labs and Tests Ordered: Current medicines are reviewed at length with the patient today.  Concerns regarding medicines are outlined above.  Orders Placed This Encounter  Procedures  . EKG 12-Lead   No orders of the  defined types were placed in this encounter.   Patient Instructions  Medication Instructions:   Your physician recommends that you continue on your current medications as directed. Please refer to the Current Medication list given to you today.  *If you need a refill on your cardiac medications before your next appointment, please call your pharmacy*   Follow-Up: At [facility], you and your health needs are our priority.  As part of our continuing mission to provide you with exceptional heart care, we have created designated Provider Care Teams.  These Care Teams include your primary Cardiologist (physician) and Advanced Practice Providers (APPs -  Physician Assistants and Nurse Practitioners) who all work together to provide you with the care you need, when you need it.  We recommend signing up for the patient portal called "MyChart".  Sign up information is provided on this After  Visit Summary.  MyChart is used to connect with patients for Virtual Visits (Telemedicine).  Patients are able to view lab/test results, encounter notes, upcoming appointments, etc.  Non-urgent messages can be sent to your provider as well.   To learn more about what you can do with MyChart, go to [email or web address].    Your next appointment:   1 year(s)  The format for your next appointment:   In Person  Provider:   Gwyndolyn Kaufman, MD         Brooks Sailors as a scribe for Freada Bergeron, MD.,have documented all relevant documentation on the behalf of Freada Bergeron, MD,as directed by  Freada Bergeron, MD while in the presence of Freada Bergeron, MD.   I, Freada Bergeron, MD, have reviewed all documentation for this visit. The documentation on 04/21/21 for the exam, diagnosis, procedures, and orders are all accurate and complete.   Signed, Freada Bergeron, MD  04/21/2021 4:32 PM    [facility]

## 2021-04-21 NOTE — Patient Instructions (Signed)

## 2021-04-21 NOTE — Addendum Note (Signed)
Addended by: Gwyndolyn Kaufman on: 04/21/2021 04:33 PM   Modules accepted: Level of Service

## 2021-09-18 ENCOUNTER — Encounter (HOSPITAL_BASED_OUTPATIENT_CLINIC_OR_DEPARTMENT_OTHER): Payer: Self-pay

## 2021-09-18 ENCOUNTER — Emergency Department (HOSPITAL_BASED_OUTPATIENT_CLINIC_OR_DEPARTMENT_OTHER)
Admission: EM | Admit: 2021-09-18 | Discharge: 2021-09-18 | Disposition: A | Discharge status: Home / Self Care | Payer: BC Managed Care – PPO | Attending: Emergency Medicine | Admitting: Emergency Medicine

## 2021-09-18 ENCOUNTER — Other Ambulatory Visit: Payer: Self-pay

## 2021-09-18 ENCOUNTER — Emergency Department (HOSPITAL_BASED_OUTPATIENT_CLINIC_OR_DEPARTMENT_OTHER): Payer: BC Managed Care – PPO

## 2021-09-18 DIAGNOSIS — Z86718 Personal history of other venous thrombosis and embolism: Secondary | ICD-10-CM

## 2021-09-18 DIAGNOSIS — M79605 Pain in left leg: Secondary | ICD-10-CM | POA: Diagnosis present

## 2021-09-18 DIAGNOSIS — M79604 Pain in right leg: Secondary | ICD-10-CM

## 2021-09-18 DIAGNOSIS — I82412 Acute embolism and thrombosis of left femoral vein: Secondary | ICD-10-CM | POA: Insufficient documentation

## 2021-09-18 HISTORY — DX: Personal history of other venous thrombosis and embolism: Z86.718

## 2021-09-18 LAB — CBC WITH DIFFERENTIAL/PLATELET
Abs Immature Granulocytes: 0.01 10*3/uL (ref 0.00–0.07)
Basophils Absolute: 0.1 10*3/uL (ref 0.0–0.1)
Basophils Relative: 1 %
Eosinophils Absolute: 0.1 10*3/uL (ref 0.0–0.5)
Eosinophils Relative: 1 %
HCT: 40.5 % (ref 36.0–46.0)
Hemoglobin: 13 g/dL (ref 12.0–15.0)
Immature Granulocytes: 0 %
Lymphocytes Relative: 29 %
Lymphs Abs: 1.6 10*3/uL (ref 0.7–4.0)
MCH: 28.3 pg (ref 26.0–34.0)
MCHC: 32.1 g/dL (ref 30.0–36.0)
MCV: 88 fL (ref 80.0–100.0)
Monocytes Absolute: 0.4 10*3/uL (ref 0.1–1.0)
Monocytes Relative: 7 %
Neutro Abs: 3.4 10*3/uL (ref 1.7–7.7)
Neutrophils Relative %: 62 %
Platelets: 204 10*3/uL (ref 150–400)
RBC: 4.6 MIL/uL (ref 3.87–5.11)
RDW: 13.6 % (ref 11.5–15.5)
WBC: 5.6 10*3/uL (ref 4.0–10.5)
nRBC: 0 % (ref 0.0–0.2)

## 2021-09-18 LAB — COMPREHENSIVE METABOLIC PANEL
ALT: 16 U/L (ref 0–44)
AST: 24 U/L (ref 15–41)
Albumin: 3.8 g/dL (ref 3.5–5.0)
Alkaline Phosphatase: 76 U/L (ref 38–126)
Anion gap: 6 (ref 5–15)
BUN: 10 mg/dL (ref 8–23)
CO2: 27 mmol/L (ref 22–32)
Calcium: 9.3 mg/dL (ref 8.9–10.3)
Chloride: 103 mmol/L (ref 98–111)
Creatinine, Ser: 0.65 mg/dL (ref 0.44–1.00)
GFR, Estimated: >60 mL/min (ref >60)
Glucose, Bld: 118 mg/dL — ABNORMAL HIGH (ref 70–99)
Potassium: 3.4 mmol/L — ABNORMAL LOW (ref 3.5–5.1)
Sodium: 136 mmol/L (ref 135–145)
Total Bilirubin: 0.2 mg/dL — ABNORMAL LOW (ref 0.3–1.2)
Total Protein: 8.3 g/dL — ABNORMAL HIGH (ref 6.5–8.1)

## 2021-09-18 MED ORDER — APIXABAN 2.5 MG PO TABS
10.0000 mg | ORAL_TABLET | Freq: Once | ORAL | Status: AC
  Administered 2021-09-18: 10 mg via ORAL
  Filled 2021-09-18: qty 4

## 2021-09-18 MED ORDER — APIXABAN (ELIQUIS) VTE STARTER PACK (10MG AND 5MG): ORAL_TABLET | ORAL | 0 refills | Status: DC

## 2021-09-18 NOTE — ED Triage Notes (Signed)
Pt arrives ambulatory to ED with cane states that she was seen at [facility] today and told to come to ED for [place] to rule out cyst or DVT. Pt reports pain to both legs states pain is worse in left left. Pt reports pain is from knee down X1 week.

## 2021-09-18 NOTE — ED Notes (Signed)
PA at bedside.

## 2021-09-18 NOTE — ED Provider Notes (Signed)
[place] EMERGENCY DEPARTMENT Provider Note   CSN: [id number] Arrival date & time: 09/18/21  1605     History Chief Complaint  Patient presents with   Leg Pain    Donna Flynn is a 70 y.o. female.  HPI Patient is a 70 year old female with a past medical history significant for chronic right knee pain, back pain, fibroids, insomnia  Patient is presented to the ER today with complaints of left leg pain she also states that she has continued ongoing right knee pain.  She states that her left knee pain/leg pain has been going on for approximately 1 week.  She states that it seems to be achy constant sore she denies any muscle spasms denies any new low back pain no saddle anesthesia.  No weakness states that she has some decreased mobility due to pain.  She denies any trauma to her knee.  No new exercises or heavy lifting.  She is seen in urgent care earlier told that she either had a Baker's cyst or DVT.  Was sent here for an ultrasound  Denies any recent long travel, no history of cancer or VTE.  She does notably work a job that is sedentary and she will sometimes sit for 10 hours without moving.    Past Medical History:  Diagnosis Date   Back pain    Fibroids    H/O hematuria    Insomnia    Irregular periods/menstrual cycles    Stress     Patient Active Problem List   Diagnosis Date Noted   Insomnia    H/O hematuria    Fibroids     Past Surgical History:  Procedure Laterality Date   back surgery     TUBAL LIGATION       OB History     Gravida  3   Para  3   Term      Preterm      AB      Living         SAB      IAB      Ectopic      Multiple      Live Births              No family history on file.  Social History   Tobacco Use   Smoking status: Never   Smokeless tobacco: Never  Vaping Use   Vaping Use: Never used  Substance Use Topics   Alcohol use: Yes    Comment: Wine on the weekends    Drug use: No     Home Medications Prior to Admission medications   Medication Sig Start Date End Date Taking? Authorizing Provider  APIXABAN Arne Cleveland) VTE STARTER PACK (10MG  AND 5MG ) Take as directed on package: start with two-5mg  tablets twice daily for 7 days. On day 8, switch to one-5mg  tablet twice daily. 09/18/21  Yes Telicia Hodgkiss S, PA  atenolol (TENORMIN) 50 MG tablet Take 50 mg by mouth daily.    [provider]  Cholecalciferol (VITAMIN D3) 125 MCG (5000 UT) CAPS Take by mouth daily.    [provider]  EUTHYROX 88 MCG tablet Take 88 mcg by mouth. Mon-Sat only 05/20/20   [provider]  levothyroxine (SYNTHROID) 100 MCG tablet Take 100 mcg by mouth. Sunday only    [provider]  magnesium oxide (MAG-OX) 400 MG tablet Take 400 mg by mouth daily.    [provider]  Menaquinone-7 (VITAMIN K2) 100  MCG CAPS Take by mouth daily.    [provider]  Multiple Vitamins-Minerals (ALIVE WOMENS ENERGY) TABS Take by mouth.    [provider]  vitamin B-12 (CYANOCOBALAMIN) 1000 MCG tablet Take by mouth.    [provider]  vitamin E 200 UNIT capsule Take 200 Units by mouth daily.    [provider]  Zinc Sulfate (ZINC-220 PO) Take by mouth daily.    [provider]    Allergies    Patient has no known allergies.  Review of Systems   Review of Systems  Constitutional:  Negative for chills and fever.  HENT:  Negative for congestion.   Eyes:  Negative for pain.  Respiratory:  Negative for cough and shortness of breath.   Cardiovascular:  Negative for chest pain and leg swelling.  Gastrointestinal:  Negative for abdominal pain and vomiting.  Genitourinary:  Negative for dysuria.  Musculoskeletal:  Negative for myalgias.       Bilateral lower extremity pain  Skin:  Negative for rash.  Neurological:  Negative for dizziness and headaches.   Physical Exam Updated Vital Signs BP (!) 162/81   Pulse 64   Temp  98.3 F (36.8 C) (Oral)   Resp 18   Ht 5\' 4"  (1.626 m)   Wt 93.9 kg   SpO2 98%   BMI 35.53 kg/m   Physical Exam Vitals and nursing note reviewed.  Constitutional:      General: She is not in acute distress. HENT:     Head: Normocephalic and atraumatic.     Nose: Nose normal.  Eyes:     General: No scleral icterus. Cardiovascular:     Rate and Rhythm: Normal rate and regular rhythm.     Pulses: Normal pulses.     Heart sounds: Normal heart sounds.  Pulmonary:     Effort: Pulmonary effort is normal. No respiratory distress.     Breath sounds: No wheezing.  Musculoskeletal:     Cervical back: Normal range of motion.     Comments: There is some mild right leg tenderness to light palpation.  No edema.  Bilateral DP PT pulses 3+ and symmetric sensation intact in bilateral lower extremities and strength intact  Skin:    General: Skin is warm and dry.     Capillary Refill: Capillary refill takes less than 2 seconds.  Neurological:     Mental Status: She is alert. Mental status is at baseline.  Psychiatric:        Mood and Affect: Mood normal.        Behavior: Behavior normal.    ED Results / Procedures / Treatments   Labs (all labs ordered are listed, but only abnormal results are displayed) Labs Reviewed  COMPREHENSIVE METABOLIC PANEL - Abnormal; Notable for the following components:      Result Value   Potassium 3.4 (*)    Glucose, Bld 118 (*)    Total Protein 8.3 (*)    Total Bilirubin 0.2 (*)    All other components within normal limits  CBC WITH DIFFERENTIAL/PLATELET    EKG None  Radiology US Venous Img Lower Bilateral (DVT)  Addendum Date: 09/18/2021   ADDENDUM REPORT: 09/18/2021 19:20 THERE IS A LEFT/RIGHT ERROR IN THE ORIGINAL REPORT.  THE CORRECTED REPORT IS BELOW.: THERE IS A LEFT/RIGHT ERROR IN THE ORIGINAL REPORT. THE CORRECTED REPORT IS BELOW. CLINICAL DATA: Bilateral leg pain and swelling. EXAM: BILATERAL LOWER EXTREMITY VENOUS DOPPLER ULTRASOUND  TECHNIQUE: Gray-scale sonography with graded compression, as  well as color doppler and duplex ultrasound were performed to evaluate the lower extremity deep venous systems from the level of the common femoral vein and including the common femoral, femoral, profunda femoral, popliteal and calf veins including the posterior tibial, peroneal and gastrocnemius veins when visible. The superficial great saphenous vein was also interrogated. Spectral Doppler was utilized to evaluate flow at rest and with distal augmentation maneuvers in the common femoral, femoral and popliteal veins. COMPARISON: None. FINDINGS: RIGHT LOWER EXTREMITY Common Femoral Vein: No evidence of thrombus. Normal compressibility, respiratory phasicity and response to augmentation. Saphenofemoral Junction: No evidence of thrombus. Normal compressibility and flow on color Doppler imaging. Profunda Femoral Vein: No evidence of thrombus. Normal compressibility and flow on color Doppler imaging. Femoral Vein: No evidence of thrombus. Normal compressibility, respiratory phasicity and response to augmentation. Popliteal Vein: No evidence of thrombus. Normal compressibility, respiratory phasicity and response to augmentation. Calf Veins: No evidence of thrombus. Normal compressibility and flow on color Doppler imaging. Superficial Great Saphenous Vein: No evidence of thrombus. Normal compressibility. Venous Reflux: None. Other Findings: None. LEFT LOWER EXTREMITY Common Femoral Vein: No evidence of thrombus. Normal compressibility, respiratory phasicity and response to augmentation. Saphenofemoral Junction: No evidence of thrombus. Normal compressibility and flow on color Doppler imaging. Profunda Femoral Vein: Nonocclusive venous thrombus is noted. Femoral Vein: Nonocclusive venous thrombus is noted in the midportion. Popliteal Vein: No evidence of thrombus. Normal compressibility, respiratory phasicity and response to augmentation. Calf Veins: No evidence  of thrombus. Normal compressibility and flow on color Doppler imaging. Superficial Great Saphenous Vein: No evidence of thrombus. Normal compressibility. Venous Reflux: None. Other Findings: None. IMPRESSION: 1. Nonocclusive venous thrombus in the left profundal femoral vein and right femoral vein. 2. No evidence of deep venous thrombosis in the right lower extremity. The corrected report was called by telephone on 09/18/2021 at 7:19 pm to provider [facility] , who verbally acknowledged the corrected report. Electronically Signed   By: Zerita Boers M.D.   On: 09/18/2021 19:20   Result Date: 09/18/2021 CLINICAL DATA:  Bilateral leg pain and swelling. EXAM: BILATERAL LOWER EXTREMITY VENOUS DOPPLER ULTRASOUND TECHNIQUE: Gray-scale sonography with graded compression, as well as color Doppler and duplex ultrasound were performed to evaluate the lower extremity deep venous systems from the level of the common femoral vein and including the common femoral, femoral, profunda femoral, popliteal and calf veins including the posterior tibial, peroneal and gastrocnemius veins when visible. The superficial great saphenous vein was also interrogated. Spectral Doppler was utilized to evaluate flow at rest and with distal augmentation maneuvers in the common femoral, femoral and popliteal veins. COMPARISON:  None. FINDINGS: RIGHT LOWER EXTREMITY Common Femoral Vein: No evidence of thrombus. Normal compressibility, respiratory phasicity and response to augmentation. Saphenofemoral Junction: No evidence of thrombus. Normal compressibility and flow on color Doppler imaging. Profunda Femoral Vein: Venous thrombus is noted. Femoral Vein: Venous thrombus is noted in the midportion. Popliteal Vein: No evidence of thrombus. Normal compressibility, respiratory phasicity and response to augmentation. Calf Veins: No evidence of thrombus. Normal compressibility and flow on color Doppler imaging. Superficial Great Saphenous Vein: No  evidence of thrombus. Normal compressibility. Venous Reflux:  None. Other Findings:  None. LEFT LOWER EXTREMITY Common Femoral Vein: No evidence of thrombus. Normal compressibility, respiratory phasicity and response to augmentation. Saphenofemoral Junction: No evidence of thrombus. Normal compressibility and flow on color Doppler imaging. Profunda Femoral Vein: No evidence of thrombus. Normal compressibility and flow on color Doppler imaging. Femoral Vein: No evidence of thrombus.  Normal compressibility, respiratory phasicity and response to augmentation. Popliteal Vein: No evidence of thrombus. Normal compressibility, respiratory phasicity and response to augmentation. Calf Veins: No evidence of thrombus. Normal compressibility and flow on color Doppler imaging. Superficial Great Saphenous Vein: No evidence of thrombus. Normal compressibility. Venous Reflux:  None. Other Findings:  None. IMPRESSION: 1. Venous thrombus in the right profundal femoral vein and right femoral vein. 2. No evidence of deep venous thrombosis in the left lower extremity. Electronically Signed: By: Zerita Boers M.D. On: 09/18/2021 17:59    Procedures Procedures   Medications Ordered in ED Medications  apixaban (ELIQUIS) tablet 10 mg (10 mg Oral Given 09/18/21 2009)    ED Course  I have reviewed the triage vital signs and the nursing notes.  Pertinent labs & imaging results that were available during my care of the patient were reviewed by me and considered in my medical decision making (see chart for details).  Clinical Course as of 09/18/21 2051  Sat Sep 18, 2021  1815 IMPRESSION: 1. Venous thrombus in the right profundal femoral vein and right femoral vein. 2. No evidence of deep venous thrombosis in the left lower extremity. [WF]  1816 Comprehensive metabolic panel(!) CMP unremarkable.  Normal kidney function and no transaminitis [WF]  1816 CBC with Differential/Platelet CBC without leukocytosis or anemia [WF]   1919 On my evaluation I am more concerned about left lower extremity DVT than right.  Discussed with ultrasound tech it seems that images may have been mislabeled.  Discussed with radiologist litton MD seems that images were correctly labeled on report however worksheets report was mislabeled.  Patient has left lower extremity DVT with nonocclusive femoral vein DVT. This will be updated in the addendum to the radiologist read. [WF]  2005 IMPRESSION: 1. Nonocclusive venous thrombus in the left profundal femoral vein and right femoral vein. 2. No evidence of deep venous thrombosis in the right lower extremity.   The corrected report was called by telephone on 09/18/2021 at 7:19 pm to provider [facility] , who verbally acknowledged the corrected report. [WF]    Clinical Course User Index [WF] Tedd Sias, PA   MDM Rules/Calculators/A&P                          Patient is a 70 year old female here for left lower extremity pain she also complains of some chronic right leg pain  On examination there is very slight swelling of the left calf and some tenderness with light palpation.  She denies any chest pain or shortness of breath lightness or dizziness.  Bilateral lower extremity ultrasounds were obtained there was some confusion and an incorrect diagnosis of DVT in the right lower extremity was made I discussed with radiologist and ultrasound tech appears that these images were mislabeled.  Is in fact the left lower extremity femoral vein that has a DVT present.  It is a nonocclusive DVT  Patient is distally neurovascularly intact on my examination. She sits for 10 hours a day sometimes without moving even once at her job  No other significant risk factors for DVT per her report  Discussed with pharmacy who will call patient and educate on Eliquis.  Given 1 dose of Eliquis here and starter pack sent to her pharmacy.  She will need to stop taking the ibuprofen she takes for her  right knee pain which is chronic.  Return precautions given.  CMP unremarkable CBC for leukocytosis or anemia.  No evidence of phlegmasia  Patient ambulatory time of discharge.  Final Clinical Impression(s) / ED Diagnoses Final diagnoses:  Acute deep vein thrombosis (DVT) of femoral vein of left lower extremity ([place])    Rx / DC Orders ED Discharge Orders          Ordered    APIXABAN (ELIQUIS) VTE STARTER PACK (10MG  AND 5MG )        09/18/21 [place], Kino Dunsworth S, [place] 09/18/21 2056    Gareth Morgan, MD 09/20/21 1825

## 2021-09-18 NOTE — Discharge Instructions (Signed)
Please hold off on taking the ibuprofen or any other NSAID  I do recommend using compression leggings these to be found at [facility]  I given you the first dose of Eliquis which is a blood thinner here in the ER.  I have [facility] the [facility] of [facility] on [address].  I have also asked our pharmacist to get you a call

## 2021-09-21 ENCOUNTER — Other Ambulatory Visit: Payer: Self-pay

## 2021-09-21 ENCOUNTER — Ambulatory Visit: Payer: Self-pay

## 2021-09-21 ENCOUNTER — Ambulatory Visit (INDEPENDENT_AMBULATORY_CARE_PROVIDER_SITE_OTHER): Payer: BC Managed Care – PPO | Admitting: Orthopaedic Surgery

## 2021-09-21 VITALS — Ht 63.0 in | Wt 207.0 lb

## 2021-09-21 DIAGNOSIS — M25561 Pain in right knee: Secondary | ICD-10-CM | POA: Diagnosis not present

## 2021-09-21 DIAGNOSIS — M25562 Pain in left knee: Secondary | ICD-10-CM

## 2021-09-21 DIAGNOSIS — G8929 Other chronic pain: Secondary | ICD-10-CM

## 2021-09-21 MED ORDER — METHYLPREDNISOLONE ACETATE 40 MG/ML IJ SUSP
40.0000 mg | INTRAMUSCULAR | Status: AC | PRN
  Administered 2021-09-21: 40 mg via INTRA_ARTICULAR

## 2021-09-21 MED ORDER — LIDOCAINE HCL 1 % IJ SOLN
2.0000 mL | INTRAMUSCULAR | Status: AC | PRN
  Administered 2021-09-21: 2 mL

## 2021-09-21 MED ORDER — BUPIVACAINE HCL 0.5 % IJ SOLN
2.0000 mL | INTRAMUSCULAR | Status: AC | PRN
  Administered 2021-09-21: 2 mL via INTRA_ARTICULAR

## 2021-09-21 NOTE — Progress Notes (Signed)
Patient ID: Donna Flynn, female   DOB: [birth year], 70 y.o.   MRN: [id number]   Chief Complaint  Patient presents with   Right Knee - Pain    Donna Flynn is a 70 y.o. female.   HPI 70 year old female here for right knee pain since 2020.  Was previous patient of Dr. Junius Roads.  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  This pain has not improved since then.  Recently diagnosed with DVT in femoral veins.  Taking eliquis for this. Pain is has gotten worse since she's been compensating for the left knee pain.   Review of Systems See hpi  Past Medical History:  Diagnosis Date   Back pain    Fibroids    H/O hematuria    Insomnia    Irregular periods/menstrual cycles    Stress     Past Surgical History:  Procedure Laterality Date   back surgery     TUBAL LIGATION      No Known Allergies  Current Outpatient Medications  Medication Sig Dispense Refill   APIXABAN (ELIQUIS) VTE STARTER PACK (10MG  AND 5MG ) Take as directed on package: start with two-5mg  tablets twice daily for 7 days. On day 8, switch to one-5mg  tablet twice daily. 1 each 0   atenolol (TENORMIN) 50 MG tablet Take 50 mg by mouth daily.     Cholecalciferol (VITAMIN D3) 125 MCG (5000 UT) CAPS Take by mouth daily.     EUTHYROX 88 MCG tablet Take 88 mcg by mouth. Mon-Sat only     levothyroxine (SYNTHROID) 100 MCG tablet Take 100 mcg by mouth. Sunday only     magnesium oxide (MAG-OX) 400 MG tablet Take 400 mg by mouth daily.     Menaquinone-7 (VITAMIN K2) 100 MCG CAPS Take by mouth daily.     Multiple Vitamins-Minerals (ALIVE WOMENS ENERGY) TABS Take by mouth.     vitamin B-12 (CYANOCOBALAMIN) 1000 MCG tablet Take by mouth.     vitamin E 200 UNIT capsule Take 200 Units by mouth daily.     Zinc Sulfate (ZINC-220 PO) Take by mouth daily.     No current facility-administered medications for this visit.     Physical Exam Height 5\' 3"  (1.6 m), weight 207 lb (93.9 kg). Physical  Exam  The patient is well developed well nourished and well groomed.  Orientation to person place and time is normal  Mood is pleasant. Ambulatory status is remarkable for a mild limp involving the involved extremity         Right Knee examination: Inspection: Tenderness is noted over the medial joint line  ROM: Is limited by pain with a maximum flexion arc of 120 Stability: Collateral ligaments are stable, the Lachman test and anterior and posterior drawer tests are normal Motor exam: Grade 5 motor strength in the quadriceps musculature  Skin: Warm dry and intact over the right leg                      Neuro: normal sensation  Vascular: 2+ DP pulse with normal color and no edema.  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  Xrays findings reviewed with the patient. Treatment options discussed to include cortisone injection, topical nsaids.  Can't take po  nsaids due to eliquis.  She tolerated the injections well today.  [facility] pamphlet provided.  Recommended strengthening and weight loss.  Follow up as needed.  Procedure Note  Patient: TAMMARA MASSING             Date of Birth: [birth year]           MRN: [id number]             Visit Date: 09/21/2021  Procedures: Visit Diagnoses:  1. Chronic pain of both knees     Large Joint Inj: bilateral knee on 09/21/2021 5:01 PM Indications: pain Details: 22 G needle  Arthrogram: No  Medications (Right): 2 mL lidocaine 1 %; 2 mL bupivacaine 0.5 %; 40 mg methylPREDNISolone acetate 40 MG/ML Medications (Left): 2 mL lidocaine 1 %; 2 mL bupivacaine 0.5 %; 40 mg methylPREDNISolone acetate 40 MG/ML Outcome: tolerated well, no immediate complications Patient was prepped and draped in the usual sterile fashion.         Azucena Cecil, MD 5:01  PM

## 2021-09-28 ENCOUNTER — Telehealth: Payer: Self-pay | Admitting: Orthopaedic Surgery

## 2021-09-28 NOTE — Telephone Encounter (Signed)
Can you submit for Bil knee gel injs

## 2021-09-28 NOTE — Telephone Encounter (Signed)
Noted.   Will submit in January, 2023 for bilateral gel injections.

## 2021-09-28 NOTE — Telephone Encounter (Signed)
Patient called advised her knees feel pretty good. Patient asked if she can get the gel injections for her next appointment in the next 3 months.  The number to contact patient is [phone]

## 2021-10-01 ENCOUNTER — Telehealth: Payer: Self-pay | Admitting: Orthopaedic Surgery

## 2021-10-01 NOTE — Telephone Encounter (Signed)
Patient called. Says she would like some fluid pills called in for her. Her knee is swollen. Her call back number is [phone]

## 2021-10-04 ENCOUNTER — Other Ambulatory Visit: Payer: Self-pay | Admitting: Physician Assistant

## 2021-10-04 NOTE — Telephone Encounter (Signed)
Unfortunately, she is on eliquis for recent dvt dx, so cannot take any nsaids to help with knee effusion.  If she is referring to fluid pills like lasix, she will need to get from pcp/cards.  Happy to have her come in to aspirate knee if she would like and the knee is swollen

## 2021-10-06 NOTE — Telephone Encounter (Signed)
Advised message below.  Appt made.

## 2021-10-19 ENCOUNTER — Other Ambulatory Visit: Payer: Self-pay

## 2021-10-19 ENCOUNTER — Ambulatory Visit: Payer: BC Managed Care – PPO | Admitting: Orthopaedic Surgery

## 2021-10-19 ENCOUNTER — Ambulatory Visit (INDEPENDENT_AMBULATORY_CARE_PROVIDER_SITE_OTHER): Payer: BC Managed Care – PPO | Admitting: Orthopaedic Surgery

## 2021-10-19 DIAGNOSIS — G8929 Other chronic pain: Secondary | ICD-10-CM | POA: Diagnosis not present

## 2021-10-19 DIAGNOSIS — M25562 Pain in left knee: Secondary | ICD-10-CM

## 2021-10-19 MED ORDER — METHYLPREDNISOLONE ACETATE 40 MG/ML IJ SUSP
40.0000 mg | INTRAMUSCULAR | Status: AC | PRN
  Administered 2021-10-19: 40 mg via INTRA_ARTICULAR

## 2021-10-19 MED ORDER — BUPIVACAINE HCL 0.5 % IJ SOLN
2.0000 mL | INTRAMUSCULAR | Status: AC | PRN
  Administered 2021-10-19: 2 mL via INTRA_ARTICULAR

## 2021-10-19 MED ORDER — LIDOCAINE HCL 1 % IJ SOLN
2.0000 mL | INTRAMUSCULAR | Status: AC | PRN
Start: 2021-10-19 — End: 2021-10-19
  Administered 2021-10-19: 2 mL

## 2021-10-19 NOTE — Progress Notes (Signed)
Office Visit Note   Patient: Donna Flynn           Date of Birth: 30-Apr-1951           MRN: [id number] Visit Date: 10/19/2021              Requested by: Bartholome Bill, [place],  [place] [address] PCP: Bartholome Bill, MD   Assessment & Plan: Visit Diagnoses:  1. Chronic pain of left knee     Plan: Impression is chronic left knee pain with effusion.  Aspiration of 15 cc and cortisone injection administered today.  Patient instructed to return in about 6 weeks if she continues to have symptoms at which point we will need an MRI to rule out structural abnormalities.  Follow-Up Instructions: No follow-ups on file.   Orders:  No orders of the defined types were placed in this encounter.  No orders of the defined types were placed in this encounter.     Procedures: Large Joint Inj: L knee on 10/19/2021 8:52 AM Details: 22 G needle Medications: 2 mL bupivacaine 0.5 %; 2 mL lidocaine 1 %; 40 mg methylPREDNISolone acetate 40 MG/ML Outcome: tolerated well, no immediate complications Patient was prepped and draped in the usual sterile fashion.      Clinical Data: No additional findings.   Subjective: Chief Complaint  Patient presents with   Left Knee - Pain   Right Knee - Pain    Donna Flynn comes in today for continued left knee pain mainly on the medial side.  Denies any injuries.  She feels swelling in the knee.  This is limiting her activity.  Denies any constitutional symptoms.   Review of Systems  Constitutional: Negative.   HENT: Negative.    Eyes: Negative.   Respiratory: Negative.    Cardiovascular: Negative.   Endocrine: Negative.   Musculoskeletal: Negative.   Neurological: Negative.   Hematological: Negative.   Psychiatric/Behavioral: Negative.    All other systems reviewed and are negative.   Objective: Vital Signs: There were no vitals taken for this visit.  Physical Exam Vitals and nursing note  reviewed.  Constitutional:      Appearance: She is well-developed.  Pulmonary:     Effort: Pulmonary effort is normal.  Skin:    General: Skin is warm.     Capillary Refill: Capillary refill takes less than 2 seconds.  Neurological:     Mental Status: She is alert and oriented to person, place, and time.  Psychiatric:        Behavior: Behavior normal.        Thought Content: Thought content normal.        Judgment: Judgment normal.    Ortho Exam  Left knee shows a joint effusion.  Slight limitation in range of motion.  Collaterals and cruciates are stable.  Medial joint line tenderness.  Specialty Comments:  No specialty comments available.  Imaging: No results found.   PMFS History: Patient Active Problem List   Diagnosis Date Noted   Insomnia    H/O hematuria    Fibroids    Past Medical History:  Diagnosis Date   Back pain    Fibroids    H/O hematuria    Insomnia    Irregular periods/menstrual cycles    Stress     No family history on file.  Past Surgical History:  Procedure Laterality Date   back surgery     TUBAL LIGATION  Social History   Occupational History   Not on file  Tobacco Use   Smoking status: Never   Smokeless tobacco: Never  Vaping Use   Vaping Use: Never used  Substance and Sexual Activity   Alcohol use: Yes    Comment: Wine on the weekends    Drug use: No   Sexual activity: Yes    Partners: Male    Birth control/protection: Surgical

## 2021-12-28 ENCOUNTER — Telehealth: Payer: Self-pay

## 2021-12-28 NOTE — Telephone Encounter (Signed)
Submitted Vob for SynviscOne, bilateral knee. BV pending.

## 2021-12-31 ENCOUNTER — Telehealth: Payer: Self-pay

## 2021-12-31 NOTE — Telephone Encounter (Signed)
Called and left a VM advising patient to CB to schedule for gel injection with Dr. Erlinda Hong.  Approved for SynviscOne, bilateral knee. Buy & Bill Must meet deductible first Patient will be responsible for 20% OOP. No Co-pay No PA required

## 2022-02-11 ENCOUNTER — Other Ambulatory Visit: Payer: Self-pay

## 2022-02-11 ENCOUNTER — Ambulatory Visit (INDEPENDENT_AMBULATORY_CARE_PROVIDER_SITE_OTHER): Payer: BC Managed Care – PPO | Admitting: Orthopaedic Surgery

## 2022-02-11 DIAGNOSIS — M1712 Unilateral primary osteoarthritis, left knee: Secondary | ICD-10-CM

## 2022-02-11 DIAGNOSIS — M170 Bilateral primary osteoarthritis of knee: Secondary | ICD-10-CM | POA: Diagnosis not present

## 2022-02-11 DIAGNOSIS — M1711 Unilateral primary osteoarthritis, right knee: Secondary | ICD-10-CM

## 2022-02-11 MED ORDER — LIDOCAINE HCL 1 % IJ SOLN
3.0000 mL | INTRAMUSCULAR | Status: AC | PRN
  Administered 2022-02-11: 3 mL

## 2022-02-11 MED ORDER — BUPIVACAINE HCL 0.25 % IJ SOLN
0.6600 mL | INTRAMUSCULAR | Status: AC | PRN
  Administered 2022-02-11: .66 mL via INTRA_ARTICULAR

## 2022-02-11 MED ORDER — HYLAN G-F 20 48 MG/6ML IX SOSY
48.0000 mg | PREFILLED_SYRINGE | INTRA_ARTICULAR | Status: AC | PRN
  Administered 2022-02-11: 48 mg via INTRA_ARTICULAR

## 2022-02-11 NOTE — Progress Notes (Signed)
? ?  Office Visit Note ?  ?Patient: Donna Flynn           ?Date of Birth: June 15, 1951           ?MRN: [id number] ?Visit Date: 02/11/2022 ?             ?Requested by: Bartholome Bill, MD ?[address],  [place] [address] ?PCP: Bartholome Bill, MD ? ? ?Assessment & Plan: ?Visit Diagnoses:  ?1. Bilateral primary osteoarthritis of knee   ? ? ?Plan: Impression is bilateral knee osteoarthritis.  Today, we proceeded with Synvisc 1 injections to both knees.  She tolerated these well.  She will follow-up with [place] as needed. ? ?Follow-Up Instructions: Return if symptoms worsen or fail to improve.  ? ?Orders:  ?Orders Placed This Encounter  ?Procedures  ? Large Joint Inj: bilateral knee  ? ?No orders of the defined types were placed in this encounter. ? ? ? ? Procedures: ?Large Joint Inj: bilateral knee on 02/11/2022 8:46 AM ?Indications: pain ?Details: 22 G needle, anterolateral approach ?Medications (Right): 0.66 mL bupivacaine 0.25 %; 3 mL lidocaine 1 %; 48 mg Hylan 48 MG/6ML ?Medications (Left): 0.66 mL bupivacaine 0.25 %; 3 mL lidocaine 1 %; 48 mg Hylan 48 MG/6ML ? ? ? ? ?Clinical Data: ?No additional findings. ? ? ?Subjective: ?Chief Complaint  ?Patient presents with  ? Right Knee - Pain  ? Left Knee - Pain  ? ? ?HPI patient is a pleasant 71 year old female with underlying osteoarthritis to both knees who comes in today for Synvisc injections to both knees.  She has not previously undergone viscosupplementation injections. ? ? ? ? ?Objective: ?Vital Signs: There were no vitals taken for this visit. ? ? ? ?Ortho Exam unchanged bilateral knee exams ? ?Specialty Comments:  ?No specialty comments available. ? ?Imaging: ?No new imaging ? ? ?PMFS History: ?Patient Active Problem List  ? Diagnosis Date Noted  ? Insomnia   ? H/O hematuria   ? Fibroids   ? ?Past Medical History:  ?Diagnosis Date  ? Back pain   ? Fibroids   ? H/O hematuria   ? Insomnia   ? Irregular periods/menstrual cycles   ?  Stress   ?  ?No family history on file.  ?Past Surgical History:  ?Procedure Laterality Date  ? back surgery    ? TUBAL LIGATION    ? ?Social History  ? ?Occupational History  ? Not on file  ?Tobacco Use  ? Smoking status: Never  ? Smokeless tobacco: Never  ?Vaping Use  ? Vaping Use: Never used  ?Substance and Sexual Activity  ? Alcohol use: Yes  ?  Comment: Wine on the weekends   ? Drug use: No  ? Sexual activity: Yes  ?  Partners: Male  ?  Birth control/protection: Surgical  ? ? ? ? ? ? ?

## 2022-08-25 ENCOUNTER — Telehealth: Payer: Self-pay | Admitting: Orthopaedic Surgery

## 2022-08-25 NOTE — Telephone Encounter (Signed)
Ok that's fine

## 2022-08-25 NOTE — Telephone Encounter (Signed)
Patient called in requesting SynviscOne, bilateral knee, please advise Last injection was 02/11/22

## 2022-08-30 NOTE — Telephone Encounter (Signed)
VOB submitted for SynviscOne, bilateral knee  

## 2022-09-13 ENCOUNTER — Telehealth: Payer: Self-pay

## 2022-09-13 NOTE — Telephone Encounter (Signed)
Talked with patient and advised her that her insurance [employer] does not cover gel injections.  Patient stated that she will contact her insurance and give me a call back.

## 2022-10-05 ENCOUNTER — Telehealth: Payer: Self-pay

## 2022-10-05 NOTE — Telephone Encounter (Signed)
Patient called concerning gel injection.  Provided patient with J code and CPT code to provide to her insurance for coverage.  Patient will give me a call back once she has information for her insurance.

## 2022-11-07 ENCOUNTER — Other Ambulatory Visit: Payer: Self-pay | Admitting: Urology

## 2022-12-23 ENCOUNTER — Encounter (HOSPITAL_BASED_OUTPATIENT_CLINIC_OR_DEPARTMENT_OTHER): Payer: Self-pay | Admitting: Urology

## 2022-12-27 ENCOUNTER — Encounter (HOSPITAL_BASED_OUTPATIENT_CLINIC_OR_DEPARTMENT_OTHER): Payer: Self-pay | Admitting: Urology

## 2022-12-27 NOTE — Progress Notes (Signed)
Spoke w/ via phone for pre-op interview--- pt Lab needs dos---- [employer], ekg              Lab results------ no COVID test -----patient states asymptomatic no test needed Arrive at ------- 0530 on 12-30-2022 NPO after MN NO Solid Food.  Clear liquids from MN until--- 0430 Med rec completed Medications to take morning of surgery ----- euthyrox, atenolol,  Diabetic medication ----- n/a Patient instructed no nail polish to be worn day of surgery Patient instructed to bring photo id and insurance card day of surgery Patient aware to have Driver (ride ) / caregiver    for 24 hours after surgery --- son, Donna Flynn Patient Special Instructions ----- n/a Pre-Op special Istructions ----- n/a Patient verbalized understanding of instructions that were given at this phone interview. Patient denies shortness of breath, chest pain, fever, cough at this phone interview.

## 2022-12-30 ENCOUNTER — Ambulatory Visit (HOSPITAL_BASED_OUTPATIENT_CLINIC_OR_DEPARTMENT_OTHER): Payer: BC Managed Care – PPO | Admitting: Anesthesiology

## 2022-12-30 ENCOUNTER — Ambulatory Visit (HOSPITAL_BASED_OUTPATIENT_CLINIC_OR_DEPARTMENT_OTHER)
Admission: RE | Admit: 2022-12-30 | Discharge: 2022-12-30 | Disposition: A | Discharge status: Home / Self Care | Payer: BC Managed Care – PPO | Attending: Urology | Admitting: Urology

## 2022-12-30 ENCOUNTER — Encounter (HOSPITAL_BASED_OUTPATIENT_CLINIC_OR_DEPARTMENT_OTHER): Payer: Self-pay | Admitting: Urology

## 2022-12-30 ENCOUNTER — Encounter (HOSPITAL_BASED_OUTPATIENT_CLINIC_OR_DEPARTMENT_OTHER)
Admission: RE | Disposition: A | Discharge status: Home / Self Care | Payer: Self-pay | Source: Home / Self Care | Attending: Urology

## 2022-12-30 DIAGNOSIS — M1990 Unspecified osteoarthritis, unspecified site: Secondary | ICD-10-CM | POA: Insufficient documentation

## 2022-12-30 DIAGNOSIS — I10 Essential (primary) hypertension: Secondary | ICD-10-CM | POA: Insufficient documentation

## 2022-12-30 DIAGNOSIS — Z6834 Body mass index (BMI) 34.0-34.9, adult: Secondary | ICD-10-CM | POA: Diagnosis not present

## 2022-12-30 DIAGNOSIS — N281 Cyst of kidney, acquired: Secondary | ICD-10-CM | POA: Diagnosis not present

## 2022-12-30 DIAGNOSIS — N3289 Other specified disorders of bladder: Secondary | ICD-10-CM

## 2022-12-30 DIAGNOSIS — E039 Hypothyroidism, unspecified: Secondary | ICD-10-CM | POA: Insufficient documentation

## 2022-12-30 DIAGNOSIS — E669 Obesity, unspecified: Secondary | ICD-10-CM | POA: Insufficient documentation

## 2022-12-30 DIAGNOSIS — I491 Atrial premature depolarization: Secondary | ICD-10-CM | POA: Diagnosis not present

## 2022-12-30 DIAGNOSIS — Z86718 Personal history of other venous thrombosis and embolism: Secondary | ICD-10-CM | POA: Diagnosis not present

## 2022-12-30 DIAGNOSIS — Z01818 Encounter for other preprocedural examination: Secondary | ICD-10-CM

## 2022-12-30 HISTORY — PX: CYSTOSCOPY: SHX5120

## 2022-12-30 HISTORY — DX: Other specified disorders of bladder: N32.89

## 2022-12-30 HISTORY — DX: Essential (primary) hypertension: I10

## 2022-12-30 HISTORY — DX: Other constipation: K59.09

## 2022-12-30 HISTORY — DX: Hypothyroidism, unspecified: E03.9

## 2022-12-30 HISTORY — DX: Presence of spectacles and contact lenses: Z97.3

## 2022-12-30 HISTORY — DX: Leiomyoma of uterus, unspecified: D25.9

## 2022-12-30 HISTORY — DX: Palpitations: R00.2

## 2022-12-30 HISTORY — DX: Herpesviral infection, unspecified: B00.9

## 2022-12-30 HISTORY — PX: TRANSURETHRAL RESECTION OF BLADDER TUMOR WITH MITOMYCIN-C: SHX6459

## 2022-12-30 HISTORY — DX: Unspecified osteoarthritis, unspecified site: M19.90

## 2022-12-30 LAB — POCT I-STAT, CHEM 8
BUN: 10 mg/dL (ref 8–23)
Calcium, Ion: 1.26 mmol/L (ref 1.15–1.40)
Chloride: 105 mmol/L (ref 98–111)
Creatinine, Ser: 0.6 mg/dL (ref 0.44–1.00)
Glucose, Bld: 87 mg/dL (ref 70–99)
HCT: 41 % (ref 36.0–46.0)
Hemoglobin: 13.9 g/dL (ref 12.0–15.0)
Potassium: 4 mmol/L (ref 3.5–5.1)
Sodium: 143 mmol/L (ref 135–145)
TCO2: 28 mmol/L (ref 22–32)

## 2022-12-30 SURGERY — CYSTOSCOPY
Anesthesia: General

## 2022-12-30 MED ORDER — FENTANYL CITRATE (PF) 100 MCG/2ML IJ SOLN
INTRAMUSCULAR | Status: AC
  Filled 2022-12-30: qty 2

## 2022-12-30 MED ORDER — PROPOFOL 10 MG/ML IV BOLUS
INTRAVENOUS | Status: DC | PRN
  Administered 2022-12-30: 200 mg via INTRAVENOUS

## 2022-12-30 MED ORDER — ONDANSETRON HCL 4 MG/2ML IJ SOLN
INTRAMUSCULAR | Status: DC | PRN
  Administered 2022-12-30: 4 mg via INTRAVENOUS

## 2022-12-30 MED ORDER — MIDAZOLAM HCL 2 MG/2ML IJ SOLN
INTRAMUSCULAR | Status: DC | PRN
  Administered 2022-12-30: 1 mg via INTRAVENOUS

## 2022-12-30 MED ORDER — CEFAZOLIN SODIUM-DEXTROSE 2-4 GM/100ML-% IV SOLN
2.0000 g | INTRAVENOUS | Status: AC
  Administered 2022-12-30: 2 g via INTRAVENOUS

## 2022-12-30 MED ORDER — OXYCODONE-ACETAMINOPHEN 5-325 MG PO TABS: 1.0000 | ORAL_TABLET | ORAL | 0 refills | Status: AC | PRN

## 2022-12-30 MED ORDER — GLYCOPYRROLATE 0.2 MG/ML IJ SOLN
INTRAMUSCULAR | Status: DC | PRN
  Administered 2022-12-30: .2 mg via INTRAVENOUS

## 2022-12-30 MED ORDER — OXYCODONE HCL 5 MG/5ML PO SOLN: 5.0000 mg | Freq: Once | ORAL | Status: DC | PRN

## 2022-12-30 MED ORDER — SUGAMMADEX SODIUM 200 MG/2ML IV SOLN: INTRAVENOUS | Status: DC | PRN

## 2022-12-30 MED ORDER — MIDAZOLAM HCL 2 MG/2ML IJ SOLN
INTRAMUSCULAR | Status: AC
  Filled 2022-12-30: qty 2

## 2022-12-30 MED ORDER — FENTANYL CITRATE (PF) 100 MCG/2ML IJ SOLN: 25.0000 ug | INTRAMUSCULAR | Status: DC | PRN

## 2022-12-30 MED ORDER — DOCUSATE SODIUM 100 MG PO CAPS: 100.0000 mg | ORAL_CAPSULE | Freq: Every day | ORAL | 0 refills | Status: AC | PRN

## 2022-12-30 MED ORDER — CEPHALEXIN 500 MG PO CAPS: 500.0000 mg | ORAL_CAPSULE | Freq: Two times a day (BID) | ORAL | 0 refills | Status: AC

## 2022-12-30 MED ORDER — FENTANYL CITRATE (PF) 100 MCG/2ML IJ SOLN
INTRAMUSCULAR | Status: DC | PRN
  Administered 2022-12-30: 50 ug via INTRAVENOUS

## 2022-12-30 MED ORDER — ROCURONIUM BROMIDE 100 MG/10ML IV SOLN
INTRAVENOUS | Status: DC | PRN
  Administered 2022-12-30: 50 mg via INTRAVENOUS

## 2022-12-30 MED ORDER — SUGAMMADEX SODIUM 200 MG/2ML IV SOLN
INTRAVENOUS | Status: DC | PRN
  Administered 2022-12-30: 200 mg via INTRAVENOUS
  Administered 2022-12-30: 100 mg via INTRAVENOUS

## 2022-12-30 MED ORDER — CEFAZOLIN SODIUM-DEXTROSE 2-4 GM/100ML-% IV SOLN
INTRAVENOUS | Status: AC
  Filled 2022-12-30: qty 100

## 2022-12-30 MED ORDER — ONDANSETRON HCL 4 MG/2ML IJ SOLN: 4.0000 mg | Freq: Four times a day (QID) | INTRAMUSCULAR | Status: DC | PRN

## 2022-12-30 MED ORDER — DEXAMETHASONE SODIUM PHOSPHATE 4 MG/ML IJ SOLN
INTRAMUSCULAR | Status: DC | PRN
  Administered 2022-12-30: 4 mg via INTRAVENOUS

## 2022-12-30 MED ORDER — LIDOCAINE HCL (CARDIAC) PF 100 MG/5ML IV SOSY
PREFILLED_SYRINGE | INTRAVENOUS | Status: DC | PRN
  Administered 2022-12-30: 50 mg via INTRAVENOUS

## 2022-12-30 MED ORDER — OXYCODONE HCL 5 MG PO TABS: 5.0000 mg | ORAL_TABLET | Freq: Once | ORAL | Status: DC | PRN

## 2022-12-30 MED ORDER — LACTATED RINGERS IV SOLN: INTRAVENOUS | Status: DC

## 2022-12-30 SURGICAL SUPPLY — 34 items
BAG DRAIN URO-CYSTO SKYTR STRL (DRAIN) ×1 IMPLANT
BAG DRN RND TRDRP ANRFLXCHMBR (UROLOGICAL SUPPLIES) ×1
BAG DRN UROCATH (DRAIN) ×1
BAG URINE DRAIN 2000ML AR STRL (UROLOGICAL SUPPLIES) ×1 IMPLANT
BLADE SURG 15 STRL LF DISP TIS (BLADE) ×1 IMPLANT
BLADE SURG 15 STRL SS (BLADE) ×1
CATH FOLEY 2WAY SLVR  5CC 16FR (CATHETERS)
CATH FOLEY 2WAY SLVR 5CC 16FR (CATHETERS) IMPLANT
CATH FOLEY INTRO SUPRA 16F (CATHETERS) IMPLANT
CLOTH BEACON ORANGE TIMEOUT ST (SAFETY) ×1 IMPLANT
COVER SURGICAL LIGHT HANDLE (MISCELLANEOUS) ×1 IMPLANT
ELECT REM PT RETURN 9FT ADLT (ELECTROSURGICAL) ×1
ELECTRODE REM PT RTRN 9FT ADLT (ELECTROSURGICAL) ×1 IMPLANT
GAUZE 4X4 16PLY ~~LOC~~+RFID DBL (SPONGE) ×1 IMPLANT
GLOVE BIO SURGEON STRL SZ7 (GLOVE) ×2 IMPLANT
GLOVE BIO SURGEON STRL SZ7.5 (GLOVE) ×1 IMPLANT
GOWN STRL REUS W/TWL LRG LVL3 (GOWN DISPOSABLE) ×1 IMPLANT
GUIDEWIRE STR DUAL SENSOR (WIRE) IMPLANT
HOLDER FOLEY CATH W/STRAP (MISCELLANEOUS) ×1 IMPLANT
KIT TURNOVER CYSTO (KITS) ×1 IMPLANT
MANIFOLD NEPTUNE II (INSTRUMENTS) ×1 IMPLANT
NS IRRIG 1000ML POUR BTL (IV SOLUTION) ×1 IMPLANT
PACK CYSTO (CUSTOM PROCEDURE TRAY) ×1 IMPLANT
PENCIL SMOKE EVACUATOR (MISCELLANEOUS) IMPLANT
SUT SILK 2 0 30  PSL (SUTURE)
SUT SILK 2 0 30 PSL (SUTURE) IMPLANT
SUT VIC AB 3-0 PS2 18 (SUTURE) ×1
SUT VIC AB 3-0 PS2 18XBRD (SUTURE) ×1 IMPLANT
SYR TOOMEY IRRIG 70ML (MISCELLANEOUS) ×1
SYRINGE TOOMEY IRRIG 70ML (MISCELLANEOUS) ×1 IMPLANT
TOWEL OR 17X26 10 PK STRL BLUE (TOWEL DISPOSABLE) ×1 IMPLANT
TUBE CONNECTING 12X1/4 (SUCTIONS) IMPLANT
WATER STERILE IRR 3000ML UROMA (IV SOLUTION) ×1 IMPLANT
YANKAUER SUCT BULB TIP NO VENT (SUCTIONS) IMPLANT

## 2022-12-30 NOTE — H&P (Signed)
Office Visit Report     12/13/2022   --------------------------------------------------------------------------------   [name]  MRN: [id number]  DOB: [birth year], 72 year old Female   PRIMARY CARE:  Precious Haws, MD  PRIMARY CARE FAX:  [phone]  REFERRING:  Janith Lima, MD  PROVIDER:  Rexene Alberts, M.D.  TREATING:  [place], [place]  LOCATION:  [facility] [phone]     --------------------------------------------------------------------------------   CC/HPI: Pt presents today for pre-operative history and physical exam in anticipation of cysto with TURBT and possible gemcitobine instillation by Dr. Abner Greenspan on 12/30/22. She is doing well and is without complaint. She has been off Eliquis for almost a year.   Pt denies F/C, HA, CP, SOB, N/V, diarrhea/constipation, back pain, flank pain, hematuria, and dysuria.     HX:   Donna Flynn is a 72 year old female seen in follow-up today with history of urinary urgency and bladder mass.   #1. Urinary urgency: In 2022, she complained of positive urinary urgency with associated urinary urge incontinence. She was placed on Myrbetriq, Gemtesa and trospium however these were all cost prohibitive. She states that she has been doing well without medication. She continues behavioral management with avoiding caffeine and alcohol. She is well satisfied with current symptoms today.   #2. Bladder mass:  -She developed microscopic hematuria in 09/2022 with urinalysis 3-10 RBC/hpf. CT hematuria protocol 10/19/2022 with 1.4 x 1.1 cm sessile polypoid lesion in the mucosal surface of the anterior bladder wall.  -Cystoscopy 10/27/2022 with nodular lesion at the posterior wall. No papillary structure.  -She denies gross hematuria. She denies smoking history. She denies a family history of urinary malignancy.   #3. Simple bilateral renal cyst: CT A/P 11/23 with simple appearing bilateral renal cysts of which no further  follow-up imaging is required.   Patient currently denies fever, chills, sweats, nausea, vomiting, abdominal or flank pain, gross hematuria or dysuria.   She has history of obesity, hypertension, thyroid disease. She takes Eliquis for history of DVT.     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/27/2022 Microscopic hematuria - 10/27/2022, - 10/19/2022, - 09/29/2022 Urinary Urgency - 10/27/2022, - 09/29/2022, - 09/30/2021 Cystocele, Unspec - 09/30/2021 Suprapubic pain - 09/30/2021 Urge incontinence - 09/30/2021 Other microscopic hematuria, Microscopic hematuria - 2014 Renal cyst, Renal cyst, acquired - 2014 Urinary Tract Inf, Unspec site, Urinary tract infection - 2014      PMH Notes: HTN  obesity  thyroid disease   NON-GU PMH: Arthritis DVT, History    FAMILY HISTORY: 3 Son's - Son Cervical Cancer - Mother Family Health Status Number - Runs In Family Father Deceased At [place] ___ - Runs In Family Malignant Pancreatic Neoplasm - Mother Mother Deceased At Age 14 from diabetic complicati - Runs In Family   SOCIAL HISTORY: Marital Status: Divorced Current Smoking Status: Patient has never smoked.   Tobacco Use Assessment Completed: Used Tobacco in last 30 days? Does not use smokeless tobacco. Drinks 1 drink per day.  Does not use drugs. Drinks 1 caffeinated drink per day. Has not had a blood transfusion.  Notes: Caffeine Use, Previous History Of Smoking, Alcohol Use, Occupation:  [employer] glass of wine 2 per weekend    REVIEW OF SYSTEMS:    GU Review Female:   Patient denies frequent urination, hard to postpone urination, burning  /pain with urination, get up at night to urinate, leakage of urine, stream starts and stops, trouble starting your stream, have to strain to urinate, and being pregnant.  Gastrointestinal (Upper):   Patient denies nausea, vomiting, and indigestion/ heartburn.  Gastrointestinal (Lower):   Patient denies diarrhea and constipation.  Constitutional:   Patient denies fever, night sweats, weight loss, and fatigue.  Skin:   Patient denies skin rash/ lesion and itching.  Eyes:   Patient denies blurred vision and double vision.  Ears/ Nose/ Throat:   Patient denies sore throat and sinus problems.  Hematologic/Lymphatic:   Patient denies swollen glands and easy bruising.  Cardiovascular:   Patient denies leg swelling and chest pains.  Respiratory:   Patient denies cough and shortness of breath.  Endocrine:   Patient denies excessive thirst.  Musculoskeletal:   Patient denies back pain and joint pain.  Neurological:   Patient denies headaches and dizziness.  Psychologic:   Patient denies depression and anxiety.   VITAL SIGNS:      12/13/2022 12:58 PM  Weight 200 lb / 90.72 kg  Height 64 in / 162.56 cm  BP 167/89 mmHg  Heart Rate 73 /min  Temperature 98.2 F / 36.7 C  BMI 34.3 kg/m   MULTI-SYSTEM PHYSICAL EXAMINATION:    Constitutional: Well-nourished. No physical deformities. Normally developed. Good grooming.  Neck: Neck symmetrical, not swollen. Normal tracheal position.  Respiratory: Normal breath sounds. No labored breathing, no use of accessory muscles.   Cardiovascular: Regular rate and rhythm. No murmur, no gallop.   Lymphatic: No enlargement of neck, axillae, groin.  Skin: No paleness, no jaundice, no cyanosis. No lesion, no ulcer, no rash.  Neurologic / Psychiatric: Oriented to time, oriented to place, oriented to person. No depression, no anxiety, no agitation.  Gastrointestinal: No mass, no tenderness, no rigidity, non obese abdomen.  Eyes: Normal conjunctivae. Normal eyelids.   Ears, Nose, Mouth, and Throat: Left ear no scars, no lesions, no masses. Right ear no scars, no lesions, no masses. Nose no scars, no lesions, no masses. Normal hearing. Normal lips.  Musculoskeletal: Normal gait and station of head and neck.     Complexity of Data:  Records Review:   Previous Patient Records  Urine Test Review:   Urinalysis   12/13/22  Urinalysis  Urine Appearance Clear   Urine Color Yellow   Urine Glucose Neg mg/dL  Urine Bilirubin Neg mg/dL  Urine Ketones Neg mg/dL  Urine Specific Gravity 1.020   Urine Blood 1+ ery/uL  Urine pH <=5.0   Urine Protein Neg mg/dL  Urine Urobilinogen 0.2 mg/dL  Urine Nitrites Neg   Urine Leukocyte Esterase Neg leu/uL  Urine WBC/hpf NS (Not Seen)   Urine RBC/hpf 3 - 10/hpf   Urine Epithelial Cells 6 - 10/hpf   Urine Bacteria NS (Not Seen)   Urine Mucous Not Present   Urine Yeast NS (Not Seen)   Urine Trichomonas Not Present   Urine Cystals NS (Not Seen)   Urine Casts NS (Not Seen)   Urine Sperm Not Present    PROCEDURES:          Urinalysis w/Scope - 81001 Dipstick Dipstick Cont'd Micro  Color: Yellow Bilirubin: Neg mg/dL WBC/hpf: NS (Not Seen)  Appearance: Clear Ketones:  Neg mg/dL RBC/hpf: 3 - 10/hpf  Specific Gravity: 1.020 Blood: 1+ ery/uL Bacteria: NS (Not Seen)  pH: <=5.0 Protein: Neg mg/dL Cystals: NS (Not Seen)  Glucose: Neg mg/dL Urobilinogen: 0.2 mg/dL Casts: NS (Not Seen)    Nitrites: Neg Trichomonas: Not Present    Leukocyte Esterase: Neg leu/uL Mucous: Not Present      Epithelial Cells: 6 - 10/hpf      Yeast: NS (Not Seen)      Sperm: Not Present    ASSESSMENT:      ICD-10 Details  1 GU:   Bladder tumor/neoplasm - D41.4    PLAN:           Schedule Return Visit/Planned Activity: Keep Scheduled Appointment - Schedule Surgery          Document Letter(s):  Created for Patient: Clinical Summary         Notes:   There are no changes in the patients history or physical exam since last evaluation by  Dr. Abner Greenspan. Pt is scheduled to undergo cysto, TURBT, and possible gemcitobine instillation on 12/30/22.   All pt's questions were answered to the best of my ability.          Next Appointment:      Next Appointment: 12/30/2022 07:30 AM    Appointment Type: Surgery     Location: [facility] [phone]    Provider: Rexene Alberts, M.D.    Reason for Visit: NE/OP CYSTO, TURBT, POSS GEMCITABINE    Urology Preoperative H&P   Chief Complaint: Bladder mass  History of Present Illness: Donna Flynn is a 72 y.o. female with bladder mass here for TURBT. Denies fevers, chills, dysuria.    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   dx ED visit in epic;   correct side RLE (deep formal vein)  provoked,  completed 6 months eliquis   (pcp did work up for blooding disorder ,  all negative)   Hypertension    Hypothyroidism    endocrinologist--  dr Chalmers Cater;   Insomnia    OA (osteoarthritis)    knees   Palpitations    cardiologist--- dr Johney Frame   Uterine leiomyoma    Wears glasses     Past Surgical History:  Procedure Laterality Date   CYSTOCELE REPAIR  03/18/2021   POSTERIOR LAMINECTOMY / DECOMPRESSION LUMBAR SPINE  07/20/2009   '@[facility]'$  by dr Trenton Gammon;   right L5--S1  and resection synovial cyst   TUBAL LIGATION Bilateral    yrs ago    Allergies: No Known Allergies  History reviewed. No pertinent family history.  Social History:  reports that she has never smoked. She has never used smokeless tobacco. She reports current alcohol use. She reports that she does not use drugs.  ROS: A complete review of systems was performed.  All systems are negative except for pertinent findings as noted.  Physical Exam:  Vital signs in last 24 hours: Temp:  [97.8 F (36.6 C)] 97.8 F (36.6 C) (02/02 0551) Pulse Rate:  [60] 60 (02/02  0551) Resp:  [17] 17 (02/02 0551) BP: (149)/(83) 149/83 (02/02 0551) SpO2:  [100 %] 100 % (02/02 0551) Weight:  [91.3 kg] 91.3 kg (02/02 0551) Constitutional:  Alert and oriented, No acute distress Cardiovascular: Regular rate and rhythm Respiratory: Normal  respiratory effort, Lungs clear bilaterally GI: Abdomen is soft, nontender, nondistended, no abdominal masses GU: No CVA tenderness Lymphatic: No lymphadenopathy Neurologic: Grossly intact, no focal deficits Psychiatric: Normal mood and affect  Laboratory Data:  Recent Labs    12/30/22 0606  HGB 13.9  HCT 41.0    Recent Labs    12/30/22 0606  NA 143  K 4.0  CL 105  GLUCOSE 87  BUN 10  CREATININE 0.60     Results for orders placed or performed during the hospital encounter of 12/30/22 (from the past 24 hour(s))  I-STAT, chem 8     Status: None   Collection Time: 12/30/22  6:06 AM  Result Value Ref Range   Sodium 143 135 - 145 mmol/L   Potassium 4.0 3.5 - 5.1 mmol/L   Chloride 105 98 - 111 mmol/L   BUN 10 8 - 23 mg/dL   Creatinine, Ser 0.60 0.44 - 1.00 mg/dL   Glucose, Bld 87 70 - 99 mg/dL   Calcium, Ion 1.26 1.15 - 1.40 mmol/L   TCO2 28 22 - 32 mmol/L   Hemoglobin 13.9 12.0 - 15.0 g/dL   HCT 41.0 36.0 - 46.0 %   No results found for this or any previous visit (from the past 240 hour(s)).  Renal Function: Recent Labs    12/30/22 0606  CREATININE 0.60   Estimated Creatinine Clearance: 72.6 mL/min (by C-G formula based on SCr of 0.6 mg/dL).  Radiologic Imaging: No results found.  I independently reviewed the above imaging studies.  Assessment and Plan Donna Flynn is a 72 y.o. female with bladder mass here for TURBT.   Risks and benefits of Transurethral Resection of Bladder Tumor were reviewed in detail including infection, bleeding, blood transfusion, injury to bladder/urethra/surrounding structures, bladder perforation, obstructive and irritative voiding symptoms, and global anesthesia risks  including but not limited to CVA, MI, DVT, PE, pneumonia, and death. Patient expressed understanding and desire to proceed.    Matt R. Konnie Noffsinger MD 12/30/2022, 7:30 AM  [facility] Pager: [phone]

## 2022-12-30 NOTE — Op Note (Signed)
Operative Note  Preoperative diagnosis:  1.  Bladder mass  Postoperative diagnosis: 1.  Bladder mass  Procedure(s): 1.  Small TURBT  Surgeon: Rexene Alberts, MD  Assistants:  None  Anesthesia:  General  Complications:  None  EBL:  Minimal  Specimens: 1.  ID Type Source Tests Collected by Time Destination  1 : Anterior Bladder Wall Bladder tumor Tissue PATH Soft tissue SURGICAL PATHOLOGY Janith Lima, MD 12/30/2022 [phone]    Drains/Catheters: 1.  None  Intraoperative findings:   2x1cm nodular mass at the anterior dome of her bladder. No papillary features. Excellent compete resection with excellent hemostasis and no perforation.  Number of tumors: 1      Size of largest tumor:    2x1cm  Characteristics of tumors:  Nodular  Primary: Yes  Suspicious for Carcinoma in situ:  No  Clinical tumor stage:  cTa  Bimanual exam under anesthesia:   Yes - no 3D mass  Visually complete resection:   Yes  Visualization of detrusor muscle in resection base:     Yes  Visual evaluation for perforation:      Performed - no evidence of perforation   Indication:  Donna Flynn is a 72 y.o. female with a nodular anterior bladder wall mass. All the risks, benefits were discussed with the patient to include but not limited to infection, pain, bleeding, damage to adjacent structures, need for further operations, adverse reaction to anesthesia and death.  Patient understands these risks and agrees to proceed with the operation as planned.    Description of procedure: After informed consent was obtained from the patient, the patient was taken to the operating room. General anesthesia was administered. The patient was placed in dorsal lithotomy position and prepped and draped in usual sterile fashion. Sequential compression devices were applied to lower extremities at the beginning of the case for DVT prophylaxis. Antibiotics were infused prior to surgery start time. A surgical time-out was  performed to properly identify the patient, the surgery to be performed, and the surgical site.     We then passed the 21-French rigid cystoscope down the urethra and into the bladder under direct vision without any difficulty. The bladder was inspected with 30 and 70 degree lenses. Once in the bladder, systematic evaluation of bladder revealed a nodular mass on the anterior dome of the bladder. The ureteral orfices were in orthotopic position and not involved.   We then removed the cystoscope and then passed down the 26 French resectoscope sheath down the urethra into the bladder under direct vision with the visual obturator. The tumor was resected down to muscle. The TUR bladder tumor chips were retrieved from the bladder and passed of the field as a specimen. Hemostasis was achieved using electrocautery. We then proceeded with removing the resectoscope. The patient tolerated the procedure well with no complication and was awoken from anesthesia and taken to recovery in stable condition.   Matt R[facility]  Pager: [phone]

## 2022-12-30 NOTE — Transfer of Care (Signed)
Immediate Anesthesia Transfer of Care Note  Patient: Donna Flynn  Procedure(s) Performed: CYSTOSCOPY TRANSURETHRAL RESECTION OF BLADDER TUMOR  Patient Location: PACU  Anesthesia Type:General  Level of Consciousness: awake, alert , oriented, and patient cooperative  Airway & Oxygen Therapy: Patient Spontanous Breathing and Patient connected to nasal cannula oxygen  Post-op Assessment: Report given to RN and Post -op Vital signs reviewed and stable  Post vital signs: Reviewed and stable  Last Vitals:  Vitals Value Taken Time  BP 137/76 12/30/22 0818  Temp    Pulse 66 12/30/22 0821  Resp 15 12/30/22 0821  SpO2 100 % 12/30/22 0821  Vitals shown include unvalidated device data.  Last Pain:  Vitals:   12/30/22 0551  TempSrc: Oral  PainSc: 0-No pain      Patients Stated Pain Goal: 2 (26/33/35 4562)  Complications: No notable events documented.

## 2022-12-30 NOTE — Anesthesia Procedure Notes (Signed)
Procedure Name: Intubation Date/Time: 12/30/2022 7:43 AM  Performed by: Georgeanne Nim, CRNAPre-anesthesia Checklist: Patient identified, Emergency Drugs available, Suction available, Patient being monitored and Timeout performed Patient Re-evaluated:Patient Re-evaluated prior to induction Oxygen Delivery Method: Circle system utilized Preoxygenation: Pre-oxygenation with 100% oxygen Induction Type: IV induction Ventilation: Mask ventilation without difficulty Laryngoscope Size: Mac, 4 and 3 Grade View: Grade III Tube size: 7.0 mm Number of attempts: 2 Airway Equipment and Method: Stylet Placement Confirmation: ETT inserted through vocal cords under direct vision, positive ETCO2 and breath sounds checked- equal and bilateral Secured at: 21 cm Tube secured with: Tape Dental Injury: Teeth and Oropharynx as per pre-operative assessment  Comments: 2 attempts made;MAC 4 by CRNA unable to view glottic opening;2nd made my MDA arytenoids only

## 2022-12-30 NOTE — Anesthesia Preprocedure Evaluation (Signed)
Anesthesia Evaluation  Patient identified by MRN, date of birth, ID band Patient awake    Reviewed: Allergy & Precautions, H&P , NPO status , Patient's Chart, lab work & pertinent test results  Airway Mallampati: II   Neck ROM: full    Dental   Pulmonary neg pulmonary ROS   breath sounds clear to auscultation       Cardiovascular hypertension,  Rhythm:regular Rate:Normal     Neuro/Psych    GI/Hepatic   Endo/Other  Hypothyroidism    Renal/GU      Musculoskeletal  (+) Arthritis ,    Abdominal   Peds  Hematology   Anesthesia Other Findings   Reproductive/Obstetrics                             Anesthesia Physical Anesthesia Plan  ASA: 2  Anesthesia Plan: General   Post-op Pain Management:    Induction: Intravenous  PONV Risk Score and Plan: 3 and Ondansetron, Dexamethasone, Midazolam and Treatment may vary due to age or medical condition  Airway Management Planned: Oral ETT  Additional Equipment:   Intra-op Plan:   Post-operative Plan: Extubation in OR  Informed Consent: I have reviewed the patients History and Physical, chart, labs and discussed the procedure including the risks, benefits and alternatives for the proposed anesthesia with the patient or authorized representative who has indicated his/her understanding and acceptance.     Dental advisory given  Plan Discussed with: CRNA, Anesthesiologist and Surgeon  Anesthesia Plan Comments:        Anesthesia Quick Evaluation

## 2022-12-30 NOTE — Anesthesia Postprocedure Evaluation (Signed)
Anesthesia Post Note  Patient: Donna Flynn  Procedure(s) Performed: CYSTOSCOPY TRANSURETHRAL RESECTION OF BLADDER TUMOR     Patient location during evaluation: PACU Anesthesia Type: General Level of consciousness: awake and alert Pain management: pain level controlled Vital Signs Assessment: post-procedure vital signs reviewed and stable Respiratory status: spontaneous breathing, nonlabored ventilation, respiratory function stable and patient connected to nasal cannula oxygen Cardiovascular status: blood pressure returned to baseline and stable Postop Assessment: no apparent nausea or vomiting Anesthetic complications: no   No notable events documented.  Last Vitals:  Vitals:   12/30/22 0845 12/30/22 0918  BP: 139/74 (!) 156/80  Pulse: 63 61  Resp: 19 19  Temp: 36.4 C   SpO2: 97% 99%    Last Pain:  Vitals:   12/30/22 0918  TempSrc:   PainSc: 0-No pain                 Alma Mohiuddin S

## 2022-12-30 NOTE — Discharge Instructions (Addendum)
Activity:  You are encouraged to ambulate frequently (about every hour during waking hours) to help prevent blood clots from forming in your legs or lungs.    Diet: You should advance your diet as instructed by your physician.  It will be normal to have some bloating, nausea, and abdominal discomfort intermittently.  Prescriptions:  You will be provided a prescription for pain medication to take as needed.  If your pain is not severe enough to require the prescription pain medication, you may take extra strength Tylenol instead which will have less side effects.  You should also take a prescribed stool softener to avoid straining with bowel movements as the prescription pain medication may constipate you.  What to call us about: You should call the office ([phone]) if you develop fever > 101 or develop persistent vomiting. Activity:  You are encouraged to ambulate frequently (about every hour during waking hours) to help prevent blood clots from forming in your legs or lungs.          Post Anesthesia Home Care Instructions  Activity: Get plenty of rest for the remainder of the day. A responsible individual must stay with you for 24 hours following the procedure.  For the next 24 hours, DO NOT: -Drive a car -Operate machinery -Drink alcoholic beverages -Take any medication unless instructed by your physician -Make any legal decisions or sign important papers.  Meals: Start with liquid foods such as gelatin or soup. Progress to regular foods as tolerated. Avoid greasy, spicy, heavy foods. If nausea and/or vomiting occur, drink only clear liquids until the nausea and/or vomiting subsides. Call your physician if vomiting continues.  Special Instructions/Symptoms: Your throat may feel dry or sore from the anesthesia or the breathing tube placed in your throat during surgery. If this causes discomfort, gargle with warm salt water. The discomfort should disappear within 24 hours.       

## 2023-01-02 ENCOUNTER — Encounter (HOSPITAL_BASED_OUTPATIENT_CLINIC_OR_DEPARTMENT_OTHER): Payer: Self-pay | Admitting: Urology

## 2023-01-02 LAB — SURGICAL PATHOLOGY

## 2023-01-05 ENCOUNTER — Telehealth: Payer: Self-pay

## 2023-01-05 NOTE — Telephone Encounter (Signed)
VOB submitted for Monovisc, bilateral knee  

## 2023-03-30 ENCOUNTER — Other Ambulatory Visit: Payer: Self-pay | Admitting: Obstetrics and Gynecology

## 2023-03-30 DIAGNOSIS — Z13820 Encounter for screening for osteoporosis: Secondary | ICD-10-CM

## 2023-11-09 ENCOUNTER — Ambulatory Visit
Admission: RE | Admit: 2023-11-09 | Discharge: 2023-11-09 | Disposition: A | Discharge status: Home / Self Care | Payer: BC Managed Care – PPO | Source: Ambulatory Visit | Attending: Obstetrics and Gynecology | Admitting: Obstetrics and Gynecology

## 2023-11-09 DIAGNOSIS — Z13820 Encounter for screening for osteoporosis: Secondary | ICD-10-CM

## 2024-09-12 NOTE — Progress Notes (Signed)
 Cardiology Office Note   Date:  09/13/2024   ID:  Donna Flynn Dec 13, 1950, MRN [id number]  PCP:  Donna Madelin Patch, MD  Cardiologist:   None Referring:  Donna Madelin Patch, MD  Chief Complaint  Patient presents with   Palpitations      History of Present Illness: Donna Flynn is a 73 y.o. female who presents for evaluation of palpitations.  She saw Dr. Hobart in 2022 for this.  She has been treated with beta-blockers but she took herself off of this because she likes to use vinegar and she heard that this would lower her blood pressure too much with the beta-blocker.  She returns for follow-up of palpitations.  She says these are sporadic.  Feel hot flashes she will have her heart beating fast.  She might have some symptoms when she bends over and stands up.  She has not had any frank syncope.  She is not describing chest pressure, neck or arm discomfort.  She has had no weight gain or edema.  She tries to do some exercising with 1200 steps in the morning and a few calisthenics.  She has had normal labs recently to include normal TSH and electrolytes.        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   dx ED visit in epic;   correct side RLE (deep formal vein)  provoked,  completed 6 months eliquis    (pcp did work up for blooding disorder ,  all negative)   Hypertension    Hypothyroidism    endocrinologist--  dr tommas;   Insomnia    OA (osteoarthritis)    knees   Palpitations    cardiologist--- dr hobart   Uterine leiomyoma    Wears glasses     Past Surgical History:  Procedure Laterality Date   CYSTOCELE REPAIR  03/18/2021   CYSTOSCOPY N/A 12/30/2022   Procedure: CYSTOSCOPY;  Surgeon: Selma Donnice SAUNDERS, MD;  Location: [facility];  Service: Urology;  Laterality: N/A;  30 MINUTES NEEDED FOR  CASE  ANESTHESIA IS GENERAL PARALYZED   POSTERIOR LAMINECTOMY / DECOMPRESSION LUMBAR SPINE  07/20/2009   @[facility]  by dr malcolm;   right L5--S1  and resection synovial cyst   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     atenolol (TENORMIN) 50 MG tablet Take 50 mg by mouth daily.     Cholecalciferol (VITAMIN D3) 125 MCG (5000 UT) CAPS Take by mouth daily.     docusate sodium  (COLACE) 100 MG capsule Take 1 capsule (100 mg total) by mouth daily as needed for up to 30 doses. 30 capsule 0   EUTHYROX 88 MCG tablet Take 88 mcg by mouth daily before breakfast. Mon-Sat only     levothyroxine (SYNTHROID) 100 MCG tablet Take 100 mcg by mouth as directed. Sunday only  magnesium oxide (MAG-OX) 400 MG tablet Take 400 mg by mouth daily.     Misc Natural Products (COLON CLEANSE) CAPS Take by mouth 3 (three) times daily as needed.     Multiple Vitamins-Minerals (ALIVE WOMENS ENERGY) TABS Take by mouth daily.     oxyCODONE -acetaminophen  (PERCOCET) 5-325 MG tablet Take 1 tablet by mouth every 4 (four) hours as needed for up to 18 doses for severe pain. 18 tablet 0   valACYclovir (VALTREX) 500 MG tablet Take 500 mg by mouth as needed.     vitamin B-12 (CYANOCOBALAMIN) 1000 MCG tablet Take 1,000 mcg by mouth daily.     vitamin E 200 UNIT capsule Take 200 Units by mouth daily.     Zinc Sulfate (ZINC-220 PO) Take by mouth daily.     diltiazem (CARDIZEM CD) 120 MG 24 hr capsule Take 1 capsule (120 mg total) by mouth daily. 90 capsule 3   No current facility-administered  medications for this visit.    Allergies:   Patient has no known allergies.    Social History:  The patient  reports that she has never smoked. She has never used smokeless tobacco. She reports current alcohol use. She reports that she does not use drugs.   Family History:  The patient's family history includes Cancer in her mother; Stroke in her father.    ROS:  Please see the history of present illness.   Otherwise, review of systems are positive for none.   All other systems are reviewed and negative.    PHYSICAL EXAM: VS:  BP (!) 173/89 (BP Location: Left Arm, Patient Position: Standing, Cuff Size: Large)   Pulse 63   Ht 5' 2 (1.575 m)   Wt 200 lb 6.4 oz (90.9 kg)   SpO2 98%   BMI 36.65 kg/m  , BMI Body mass index is 36.65 kg/m. GENERAL:  Well appearing HEENT:  Pupils equal round and reactive, fundi not visualized, oral mucosa unremarkable NECK:  No jugular venous distention, waveform within normal limits, carotid upstroke brisk and symmetric, no bruits, no thyromegaly LYMPHATICS:  No cervical, inguinal adenopathy LUNGS:  Clear to auscultation bilaterally BACK:  No CVA tenderness CHEST:  Unremarkable HEART:  PMI not displaced or sustained,S1 and S2 within normal limits, no S3, no S4, no clicks, no rubs, no murmurs ABD:  Flat, positive bowel sounds normal in frequency in pitch, no bruits, no rebound, no guarding, no midline pulsatile mass, no hepatomegaly, no splenomegaly EXT:  2 plus pulses throughout, no edema, no cyanosis no clubbing SKIN:  No rashes no nodules NEURO:  Cranial nerves II through XII grossly intact, motor grossly intact throughout [facility]:  Cognitively intact, oriented to person place and time    EKG:  EKG Interpretation Date/Time:  Friday September 13 2024 09:24:31 EDT Ventricular Rate:  63 PR Interval:  136 QRS Duration:  88 QT Interval:  408 QTC Calculation: 417 R Axis:   -21  Text Interpretation: Normal sinus rhythm Moderate voltage criteria for  LVH, may be normal variant ( R in aVL , Cornell product ) When compared with ECG of 30-Dec-2022 06:13, Premature atrial complexes are no longer Present Confirmed by Lavona Agent ([id number]) on 09/13/2024 9:31:43 AM     Recent Labs: No results found for requested labs within last 365 days.    Lipid Panel No results found for: CHOL, TRIG, HDL, CHOLHDL, VLDL, LDLCALC, LDLDIRECT    Wt Readings from Last 3 Encounters:  09/13/24 200 lb 6.4 oz (90.9 kg)  12/30/22  201 lb 4.8 oz (91.3 kg)  09/21/21 207 lb (93.9 kg)      Other studies Reviewed: Additional studies/ records that were reviewed today include: Labs, primary care office recrods. Review of the above records demonstrates:  Please see elsewhere in the note.     ASSESSMENT AND PLAN:   Palpitations: I am going to encourage her because of her blood pressure to start Cardizem 120 and see if she tolerates this and if it helps with both the blood pressure and palpitations.  I do not think another monitor is indicated at this point because the symptoms are so short-lived.  Of note in the office today she was not orthostatic.     HTN: This will be managed in the context of the above.  Current medicines are reviewed at length with the patient today.  The patient does not have concerns regarding medicines.  The following changes have been made:  As above.  Labs/ tests ordered today include:   Orders Placed This Encounter  Procedures   EKG 12-Lead     Disposition:   FU with APP 3 months.     Signed, Lynwood Schilling, MD  09/13/2024 10:16 AM    [facility]

## 2024-09-13 ENCOUNTER — Other Ambulatory Visit (HOSPITAL_COMMUNITY): Payer: Self-pay

## 2024-09-13 ENCOUNTER — Ambulatory Visit: Attending: Cardiology | Admitting: Cardiology

## 2024-09-13 VITALS — BP 173/89 | HR 63 | Ht 62.0 in | Wt 200.4 lb

## 2024-09-13 DIAGNOSIS — I10 Essential (primary) hypertension: Secondary | ICD-10-CM | POA: Diagnosis not present

## 2024-09-13 DIAGNOSIS — R002 Palpitations: Secondary | ICD-10-CM | POA: Diagnosis not present

## 2024-09-13 MED ORDER — DILTIAZEM HCL ER COATED BEADS 120 MG PO CP24: 120.0000 mg | ORAL_CAPSULE | Freq: Every day | ORAL | 3 refills | Status: DC

## 2024-09-13 MED ORDER — DILTIAZEM HCL ER COATED BEADS 120 MG PO CP24
120.0000 mg | ORAL_CAPSULE | Freq: Every day | ORAL | 3 refills | Status: AC
  Filled 2024-09-13 (×2): qty 30, 30d supply, fill #0

## 2024-09-13 NOTE — Patient Instructions (Signed)
 Medication Instructions:  Start Cardizem 120 mg once daily *If you need a refill on your cardiac medications before your next appointment, please call your pharmacy*  Lab Work: NONE If you have labs (blood work) drawn today and your tests are completely normal, you will receive your results only by: MyChart Message (if you have MyChart) OR A paper copy in the mail If you have any lab test that is abnormal or we need to change your treatment, we will call you to review the results.  Testing/Procedures: NONE  Follow-Up: At [facility], you and your health needs are our priority.  As part of our continuing mission to provide you with exceptional heart care, our providers are all part of one team.  This team includes your primary Cardiologist (physician) and Advanced Practice Providers or APPs (Physician Assistants and Nurse Practitioners) who all work together to provide you with the care you need, when you need it.  Your next appointment:   3 month(s)  Provider:   One of our Advanced Practice Providers (APPs): Morse Clause, PA-C  Lamarr Satterfield, NP Miriam Shams, NP  Olivia Pavy, PA-C Josefa Beauvais, NP  Leontine Salen, PA-C Orren Fabry, PA-C  Hao Meng, PA-C Ernest Dick, NP  Damien Braver, NP Jon Hails, PA-C  Waddell Donath, PA-C    Dayna Dunn, PA-C  Scott Weaver, PA-C Lum Louis, NP Katlyn West, NP Callie Goodrich, PA-C  Xika Zhao, NP Sheng Haley, PA-C    Kathleen Johnson, PA-C    We recommend signing up for the patient portal called MyChart.  Sign up information is provided on this After Visit Summary.  MyChart is used to connect with patients for Virtual Visits (Telemedicine).  Patients are able to view lab/test results, encounter notes, upcoming appointments, etc.  Non-urgent messages can be sent to your provider as well.   To learn more about what you can do with MyChart, go to [email or web address].   Other Instructions [facility] Blood Pressure Cuff

## 2024-11-24 NOTE — Progress Notes (Unsigned)
" ° °  Cardiology Office Note:    Date:  11/24/2024   ID:  JANEYA DEYO, DOB 07/10/1951, MRN [id number]  PCP:  Jolee Madelin Patch, MD  Cardiologist:  None { Click to update primary MD,subspecialty MD or APP then REFRESH:1}    Referring MD: Jolee Madelin Patch, MD   Chief Complaint: follow-up of palpitations and hypertension  History of Present Illness:    Donna Flynn is a 73 y.o. 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 Lavona and presents today for follow-up of palpitations and hypertension.  Patient was initially referred to Dr. Hobart in 03/2021 for palpitations which seem to be correlated to her Synthroid dose. Her Synthroid was reduced and her palpitations significantly improved. She was not seen by Cardiology again until recently when she was seen by Dr. Lavona in 08/2024 again for palpitations. She had stopped taking her beta-blocker on her own and her BP was markedly elevated. She was started on Cardizem . A formal monitor was not felt to be needed given brief episodes of palpitations.  Patient presents today for follow-up. ***   Palpitations History of palpitations. *** - *** - Continue Cardizem  120mg  daily.   Hypertension BP *** - Continue Cardizem  120mg  daily.   EKGs/Labs/Other Studies Reviewed:    The following studies were reviewed:  Lower Extremity Venous Dopplers in 08/2021: Impressions: 1. Nonocclusive venous thrombus in the left profundal femoral vein and right femoral vein. 2. No evidence of deep venous thrombosis in the right lower extremity.  EKG:  EKG not ordered today.   Recent Labs: No results found for requested labs within last 365 days.  Recent Lipid Panel No results found for: CHOL, TRIG, HDL, CHOLHDL, VLDL, LDLCALC, LDLDIRECT  Physical Exam:    Vital Signs: There were no vitals  taken for this visit.    Wt Readings from Last 3 Encounters:  09/13/24 200 lb 6.4 oz (90.9 kg)  12/30/22 201 lb 4.8 oz (91.3 kg)  09/21/21 207 lb (93.9 kg)     General: 73 y.o. female in no acute distress. HEENT: Normocephalic and atraumatic. Sclera clear.  Neck: Supple. No carotid bruits. No JVD. Heart: *** RRR. Distinct S1 and S2. No murmurs, gallops, or rubs.  Lungs: No increased work of breathing. Clear to ausculation bilaterally. No wheezes, rhonchi, or rales.  Abdomen: Soft, non-distended, and non-tender to palpation.  Extremities: No lower extremity edema.  Radial and distal pedal pulses 2+ and equal bilaterally. Skin: Warm and dry. Neuro: No focal deficits. Psych: Normal affect. Responds appropriately.   Assessment:    No diagnosis found.  Plan:     Disposition: Follow up in ***   Signed, Aline FORBES Door, PA-C  11/24/2024 10:23 AM     [facility] "

## 2024-12-01 NOTE — Progress Notes (Unsigned)
" °  Cardiology Office Note   Date: 12/02/2024  ID:  Donna Flynn 08-28-1951 [id number] PCP: Jolee Madelin Patch, MD  [facility] Providers Cardiologist: Lynwood Schilling, MD     Chief Complaint: Donna Flynn is a 74 y.o.female with PMH of hypertension, palpitations, acquired hypothyroidism with history of thyroidectomy who presents to the clinic for 72-month follow-up.    Donna Flynn was seen in 2022 by Dr. Hobart for further evaluation of palpitations.  Synthroid dose was adjusted and her symptoms resolved.  Seen again 08/2024 by Dr. Schilling with palpitations.  She had self-discontinued beta blocker due to concerns about hypotension regarding using vinegar in conjunction with this medication.  She was hypertensive, 173/89 at this visit. It was recommended that she start diltiazem .    History of Present Illness: Today she is doing well. Notes improvement in palpitations with starting diltiazem . She is encouraged by her BP with this medication as well. She feels that Synthroid dose contributes significantly to her symptoms as well. She is working hard to stay hydrated and exercise, looking into buying a treadmill but currently using a recumbent bike every morning. She asks about a prior chest CT that showed thoracic aorta measuring 40 mm with repeat CTA showing 36 mm.   ROS: Denies chest pain, shortness of breath, lower extremity edema, lightheadedness, dizziness, syncope.     Physical Exam: VS: BP 122/80   Pulse 71   Ht 5' 2 (1.575 m)   Wt 201 lb 12.8 oz (91.5 kg)   SpO2 98%   BMI 36.91 kg/m   GEN: Well nourished, in NAD HEENT: Normal NECK: No JVD CARDIAC: RRR, no murmurs, rubs, gallops RESPIRATORY: Clear to auscultation bilaterally ABDOMEN: Soft, non-tender, non-distended MUSCULOSKELETAL: No edema  SKIN: Warm and dry NEUROLOGIC:  Alert and oriented x 3 PSYCHIATRIC:  Anxious, pleasant   Assessment & Plan: 1. Palpitations: Symptoms improved with Synthroid  adjustment in the past. 09/02/2024 TSH 2.628. Notes significant improvement in symptoms with starting diltiazem  since last office visit.  - Continue diltiazem  120 mg daily  2. 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 She denies a family history of this. She does not feel strongly about repeating testing for this and I agree with this plan given CTA results. - Continue hypertension management  3. Hypertension: BP today 122/80. - Recommend checking BP at home two hours after medication - Continue diltiazem  120 mg daily as above  4. Acquired hypothyroidism: She is s/p thyroidectomy. 09/02/2024 TSH 2.628. - Follow with endocrinologist for management of Synthroid    Dispo: Follow-up in 6 months with Dr. Schilling.  Signed, Saddie GORMAN Cleaves, NP 12/02/2024 4:22 PM [facility] "

## 2024-12-02 ENCOUNTER — Ambulatory Visit

## 2024-12-02 ENCOUNTER — Encounter: Payer: Self-pay | Admitting: Student

## 2024-12-02 VITALS — BP 122/80 | HR 71 | Ht 62.0 in | Wt 201.8 lb

## 2024-12-02 DIAGNOSIS — I10 Essential (primary) hypertension: Secondary | ICD-10-CM

## 2024-12-02 DIAGNOSIS — R002 Palpitations: Secondary | ICD-10-CM

## 2024-12-02 NOTE — Patient Instructions (Signed)
 Thank you for choosing [facility]!     Medication Instructions:  No medication changes were made during today's visit.  *If you need a refill on your cardiac medications before your next appointment, please call your pharmacy*   Lab Work: No labs were ordered during today's visit.  If you have labs (blood work) drawn today and your tests are completely normal, you will receive your results only by: MyChart Message (if you have MyChart) OR A paper copy in the mail If you have any lab test that is abnormal or we need to change your treatment, we will call you to review the results.   Testing/Procedures: No procedures were ordered during today's visit.   Your next appointment:   6 month(s)   Provider:   Lynwood Schilling, MD     Follow-Up: At [facility], you and your health needs are our priority.  As part of our continuing mission to provide you with exceptional heart care, we have created designated Provider Care Teams.  These Care Teams include your primary Cardiologist (physician) and Advanced Practice Providers (APPs -  Physician Assistants and Nurse Practitioners) who all work together to provide you with the care you need, when you need it. We recommend signing up for the patient portal called MyChart.  Sign up information is provided on this After Visit Summary.  MyChart is used to connect with patients for Virtual Visits (Telemedicine).  Patients are able to view lab/test results, encounter notes, upcoming appointments, etc.  Non-urgent messages can be sent to your provider as well.   To learn more about what you can do with MyChart, go to [email or web address].

## 2024-12-03 ENCOUNTER — Ambulatory Visit: Admitting: Student
